# Patient Record
Sex: Male | Born: 1960 | Race: Black or African American | Hispanic: No | Smoking: Current every day smoker
Health system: Southern US, Community
[De-identification: ages and names within clinical notes are randomized; demographics above are authoritative.]

## PROBLEM LIST (undated history)

## (undated) DIAGNOSIS — C801 Malignant (primary) neoplasm, unspecified: Secondary | ICD-10-CM

## (undated) HISTORY — PX: NO PAST SURGERIES: SHX2092

---

## 2003-07-29 ENCOUNTER — Emergency Department (HOSPITAL_COMMUNITY): Admission: EM | Admit: 2003-07-29 | Discharge: 2003-07-29 | Payer: Self-pay | Admitting: Emergency Medicine

## 2005-02-22 ENCOUNTER — Emergency Department (HOSPITAL_COMMUNITY): Admission: EM | Admit: 2005-02-22 | Discharge: 2005-02-22 | Payer: Self-pay | Admitting: Emergency Medicine

## 2006-05-22 ENCOUNTER — Emergency Department (HOSPITAL_COMMUNITY): Admission: EM | Admit: 2006-05-22 | Discharge: 2006-05-22 | Payer: Self-pay | Admitting: Emergency Medicine

## 2008-06-24 ENCOUNTER — Emergency Department (HOSPITAL_COMMUNITY): Admission: EM | Admit: 2008-06-24 | Discharge: 2008-06-24 | Payer: Self-pay | Admitting: Emergency Medicine

## 2009-04-23 ENCOUNTER — Emergency Department (HOSPITAL_COMMUNITY): Admission: EM | Admit: 2009-04-23 | Discharge: 2009-04-23 | Payer: Self-pay | Admitting: Emergency Medicine

## 2010-06-04 LAB — URINALYSIS, ROUTINE W REFLEX MICROSCOPIC
Ketones, ur: NEGATIVE mg/dL
Nitrite: NEGATIVE
Protein, ur: NEGATIVE mg/dL
pH: 6 (ref 5.0–8.0)

## 2010-06-04 LAB — CBC
Platelets: 201 10*3/uL (ref 150–400)
RDW: 13.4 % (ref 11.5–15.5)

## 2010-06-04 LAB — DIFFERENTIAL
Basophils Relative: 0 % (ref 0–1)
Eosinophils Absolute: 0 10*3/uL (ref 0.0–0.7)
Monocytes Absolute: 0.4 10*3/uL (ref 0.1–1.0)
Monocytes Relative: 6 % (ref 3–12)

## 2010-06-04 LAB — PROTIME-INR: INR: 0.89 (ref 0.00–1.49)

## 2010-06-04 LAB — COMPREHENSIVE METABOLIC PANEL
ALT: 19 U/L (ref 0–53)
Albumin: 4 g/dL (ref 3.5–5.2)
Alkaline Phosphatase: 81 U/L (ref 39–117)
Potassium: 4.6 mEq/L (ref 3.5–5.1)
Sodium: 138 mEq/L (ref 135–145)
Total Protein: 7.1 g/dL (ref 6.0–8.3)

## 2010-11-23 ENCOUNTER — Emergency Department (HOSPITAL_COMMUNITY)
Admission: EM | Admit: 2010-11-23 | Discharge: 2010-11-23 | Disposition: A | Discharge status: Home / Self Care | Payer: Self-pay | Attending: Emergency Medicine | Admitting: Emergency Medicine

## 2010-11-23 DIAGNOSIS — IMO0002 Reserved for concepts with insufficient information to code with codable children: Secondary | ICD-10-CM | POA: Insufficient documentation

## 2010-11-23 DIAGNOSIS — H1130 Conjunctival hemorrhage, unspecified eye: Secondary | ICD-10-CM | POA: Insufficient documentation

## 2010-11-23 DIAGNOSIS — S0510XA Contusion of eyeball and orbital tissues, unspecified eye, initial encounter: Secondary | ICD-10-CM | POA: Insufficient documentation

## 2014-06-21 ENCOUNTER — Encounter (HOSPITAL_COMMUNITY): Payer: Self-pay

## 2014-06-21 ENCOUNTER — Emergency Department (HOSPITAL_COMMUNITY)
Admission: EM | Admit: 2014-06-21 | Discharge: 2014-06-21 | Disposition: A | Discharge status: Home / Self Care | Payer: No Typology Code available for payment source | Source: Home / Self Care | Attending: Family Medicine | Admitting: Family Medicine

## 2014-06-21 DIAGNOSIS — J302 Other seasonal allergic rhinitis: Secondary | ICD-10-CM | POA: Diagnosis not present

## 2014-06-21 DIAGNOSIS — J0101 Acute recurrent maxillary sinusitis: Secondary | ICD-10-CM

## 2014-06-21 MED ORDER — METHYLPREDNISOLONE ACETATE 80 MG/ML IJ SUSP
80.0000 mg | Freq: Once | INTRAMUSCULAR | Status: AC
  Administered 2014-06-21: 80 mg via INTRAMUSCULAR

## 2014-06-21 MED ORDER — FLUTICASONE PROPIONATE 50 MCG/ACT NA SUSP: 1.0000 | Freq: Two times a day (BID) | NASAL | Status: DC

## 2014-06-21 MED ORDER — METHYLPREDNISOLONE ACETATE 80 MG/ML IJ SUSP
INTRAMUSCULAR | Status: AC
  Filled 2014-06-21: qty 1

## 2014-06-21 MED ORDER — DOXYCYCLINE HYCLATE 100 MG PO CAPS: 100.0000 mg | ORAL_CAPSULE | Freq: Two times a day (BID) | ORAL | Status: DC

## 2014-06-21 NOTE — Discharge Instructions (Signed)
Take all of medicine, drink lots of fluids, no more smoking, see your doctor if further problems  °

## 2014-06-21 NOTE — ED Notes (Signed)
C/o has been congested since mowed the yard 4-2; minimal relief w OTC

## 2014-06-21 NOTE — ED Provider Notes (Signed)
CSN: [id number]     Arrival date & time 06/21/14  1342 History   First MD Initiated Contact with Patient 06/21/14 1432     Chief Complaint  Patient presents with  . Nasal Congestion   (Consider location/radiation/quality/duration/timing/severity/associated sxs/prior Treatment) Patient is a 54 y.o. male presenting with cough. The history is provided by the patient.  Cough Cough characteristics:  Productive Sputum characteristics:  Green Severity:  Moderate Onset quality:  Gradual Duration:  4 days Progression:  Unchanged Chronicity:  New Smoker: yes   Context: exposure to allergens, upper respiratory infection and weather changes   Associated symptoms: rhinorrhea and sinus congestion   Associated symptoms: no fever, no sore throat and no wheezing     History reviewed. No pertinent past medical history. History reviewed. No pertinent past surgical history. History reviewed. No pertinent family history. History  Substance Use Topics  . Smoking status: Never Smoker   . Smokeless tobacco: Not on file  . Alcohol Use: Not on file    Review of Systems  Constitutional: Negative.  Negative for fever.  HENT: Positive for congestion, postnasal drip and rhinorrhea. Negative for sore throat.   Respiratory: Positive for cough. Negative for wheezing.   Cardiovascular: Negative.   Gastrointestinal: Negative.     Allergies  Review of patient's allergies indicates no active allergies.  Home Medications   Prior to Admission medications   Medication Sig Start Date End Date Taking? Authorizing Provider  doxycycline (VIBRAMYCIN) 100 MG capsule Take 1 capsule (100 mg total) by mouth 2 (two) times daily. 06/21/14   Linna HoffJames D Kindl, MD  fluticasone (FLONASE) 50 MCG/ACT nasal spray Place 1 spray into both nostrils 2 (two) times daily. 06/21/14   Linna HoffJames D Kindl, MD   BP 117/80 mmHg  Pulse 114  Temp(Src) 98.8 F (37.1 C) (Oral)  SpO2 95% Physical Exam  Constitutional: He is oriented to person,  place, and time. He appears well-developed and well-nourished. No distress.  HENT:  Right Ear: External ear normal.  Left Ear: External ear normal.  Mouth/Throat: Oropharynx is clear and moist.  Eyes: Conjunctivae are normal. Pupils are equal, round, and reactive to light.  Neck: Normal range of motion. Neck supple.  Cardiovascular: Normal rate and normal heart sounds.   Pulmonary/Chest: He has decreased breath sounds. He has no wheezes. He has rhonchi. He has no rales.  Lymphadenopathy:    He has no cervical adenopathy.  Neurological: He is alert and oriented to person, place, and time.  Skin: Skin is warm and dry.  Nursing note and vitals reviewed.   ED Course  Procedures (including critical care time) Labs Review Labs Reviewed - No data to display  Imaging Review No results found.   MDM   1. Seasonal allergic rhinitis   2. Acute recurrent maxillary sinusitis        Linna HoffJames D Kindl, MD 06/21/14 1501

## 2015-01-23 ENCOUNTER — Encounter (HOSPITAL_COMMUNITY): Payer: Self-pay

## 2015-01-23 ENCOUNTER — Emergency Department (HOSPITAL_COMMUNITY): Payer: No Typology Code available for payment source

## 2015-01-23 ENCOUNTER — Emergency Department (HOSPITAL_COMMUNITY)
Admission: EM | Admit: 2015-01-23 | Discharge: 2015-01-23 | Disposition: A | Discharge status: Home / Self Care | Payer: No Typology Code available for payment source | Attending: Emergency Medicine | Admitting: Emergency Medicine

## 2015-01-23 ENCOUNTER — Other Ambulatory Visit (HOSPITAL_COMMUNITY): Payer: Self-pay | Admitting: Orthopedic Surgery

## 2015-01-23 ENCOUNTER — Encounter (HOSPITAL_COMMUNITY): Payer: Self-pay | Admitting: *Deleted

## 2015-01-23 DIAGNOSIS — W230XXA Caught, crushed, jammed, or pinched between moving objects, initial encounter: Secondary | ICD-10-CM | POA: Diagnosis not present

## 2015-01-23 DIAGNOSIS — Y998 Other external cause status: Secondary | ICD-10-CM | POA: Diagnosis not present

## 2015-01-23 DIAGNOSIS — S99912A Unspecified injury of left ankle, initial encounter: Secondary | ICD-10-CM | POA: Diagnosis present

## 2015-01-23 DIAGNOSIS — Y9289 Other specified places as the place of occurrence of the external cause: Secondary | ICD-10-CM | POA: Diagnosis not present

## 2015-01-23 DIAGNOSIS — Z720 Tobacco use: Secondary | ICD-10-CM | POA: Insufficient documentation

## 2015-01-23 DIAGNOSIS — S82842A Displaced bimalleolar fracture of left lower leg, initial encounter for closed fracture: Secondary | ICD-10-CM | POA: Diagnosis not present

## 2015-01-23 DIAGNOSIS — Y9389 Activity, other specified: Secondary | ICD-10-CM | POA: Diagnosis not present

## 2015-01-23 MED ORDER — OXYCODONE-ACETAMINOPHEN 5-325 MG PO TABS
1.0000 | ORAL_TABLET | Freq: Once | ORAL | Status: AC
  Administered 2015-01-23: 1 via ORAL
  Filled 2015-01-23: qty 1

## 2015-01-23 MED ORDER — OXYCODONE-ACETAMINOPHEN 5-325 MG PO TABS: 1.0000 | ORAL_TABLET | ORAL | Status: DC | PRN
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
## 2015-01-23 NOTE — ED Notes (Signed)
Patient transported to X-ray 

## 2015-01-23 NOTE — ED Notes (Signed)
Ice applied to ankle

## 2015-01-23 NOTE — Discharge Instructions (Signed)
Ankle Fracture °A fracture is a break in a bone. The ankle joint is made up of three bones. These include the lower (distal) sections of your lower leg bones, called the tibia and fibula, along with a bone in your foot, called the talus. Depending on how bad the break is and if more than one ankle joint bone is broken, a cast or splint is used to protect and keep your injured bone from moving while it heals. Sometimes, surgery is required to help the fracture heal properly.  °There are two general types of fractures: °· Stable fracture. This includes a single fracture line through one bone, with no injury to ankle ligaments. A fracture of the talus that does not have any displacement (movement of the bone on either side of the fracture line) is also stable. °· Unstable fracture. This includes more than one fracture line through one or more bones in the ankle joint. It also includes fractures that have displacement of the bone on either side of the fracture line. °CAUSES °· A direct blow to the ankle.   °· Quickly and severely twisting your ankle. °· Trauma, such as a car accident or falling from a significant height. °RISK FACTORS °You may be at a higher risk of ankle fracture if: °· You have certain medical conditions. °· You are involved in high-impact sports. °· You are involved in a high-impact car accident. °SIGNS AND SYMPTOMS  °· Tender and swollen ankle. °· Bruising around the injured ankle. °· Pain on movement of the ankle. °· Difficulty walking or putting weight on the ankle. °· A cold foot below the site of the ankle injury. This can occur if the blood vessels passing through your injured ankle were also damaged. °· Numbness in the foot below the site of the ankle injury. °DIAGNOSIS  °An ankle fracture is usually diagnosed with a physical exam and X-rays. A CT scan may also be required for complex fractures. °TREATMENT  °Stable fractures are treated with a cast or splint and using crutches to avoid putting  weight on your injured ankle. This is followed by an ankle strengthening program. Some patients require a special type of cast, depending on other medical problems they may have. Unstable fractures require surgery to ensure the bones heal properly. Your health care provider will tell you what type of fracture you have and the best treatment for your condition. °HOME CARE INSTRUCTIONS  °· Review correct crutch use with your health care provider and use your crutches as directed. Safe use of crutches is extremely important. Misuse of crutches can cause you to fall or cause injury to nerves in your hands or armpits. °· Do not put weight or pressure on the injured ankle until directed by your health care provider. °· To lessen the swelling, keep the injured leg elevated while sitting or lying down. °· Apply ice to the injured area: °¨ Put ice in a plastic bag. °¨ Place a towel between your cast and the bag. °¨ Leave the ice on for 20 minutes, 2-3 times a day. °· If you have a plaster or fiberglass cast: °¨ Do not try to scratch the skin under the cast with any objects. This can increase your risk of skin infection. °¨ Check the skin around the cast every day. You may put lotion on any red or sore areas. °¨ Keep your cast dry and clean. °· If you have a plaster splint: °¨ Wear the splint as directed. °¨ You may loosen the elastic   around the splint if your toes become numb, tingle, or turn cold or blue.  Do not put pressure on any part of your cast or splint; it may break. Rest your cast only on a pillow the first 24 hours until it is fully hardened.  Your cast or splint can be protected during bathing with a plastic bag sealed to your skin with medical tape. Do not lower the cast or splint into water.  Take medicines as directed by your health care provider. Only take over-the-counter or prescription medicines for pain, discomfort, or fever as directed by your health care provider.  Do not drive a vehicle until  your health care provider specifically tells you it is safe to do so.  If your health care provider has given you a follow-up appointment, it is very important to keep that appointment. Not keeping the appointment could result in a chronic or permanent injury, pain, and disability. If you have any problem keeping the appointment, call the facility for assistance. SEEK MEDICAL CARE IF: You develop increased swelling or discomfort. SEEK IMMEDIATE MEDICAL CARE IF:   Your cast gets damaged or breaks.  You have continued severe pain.  You develop new pain or swelling after the cast was put on.  Your skin or toenails below the injury turn blue or gray.  Your skin or toenails below the injury feel cold, numb, or have loss of sensitivity to touch.  There is a bad smell or pus draining from under the cast. MAKE SURE YOU:   Understand these instructions.  Will watch your condition.  Will get help right away if you are not doing well or get worse.   This information is not intended to replace advice given to you by your health care provider. Make sure you discuss any questions you have with your health care provider.   Document Released: 02/27/2000 Document Revised: 03/06/2013 Document Reviewed: 09/28/2012 Elsevier Interactive Patient Education 2016 [employer]   Please follow-up immediately with Dr. Lajoyce Cornersuda for further evaluation and management
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
## 2015-01-24 ENCOUNTER — Encounter (HOSPITAL_COMMUNITY)
Admission: RE | Disposition: A | Discharge status: Home / Self Care | Payer: Self-pay | Source: Ambulatory Visit | Attending: Orthopedic Surgery

## 2015-01-24 ENCOUNTER — Ambulatory Visit (HOSPITAL_COMMUNITY): Payer: No Typology Code available for payment source

## 2015-01-24 ENCOUNTER — Ambulatory Visit (HOSPITAL_COMMUNITY): Payer: No Typology Code available for payment source | Admitting: Anesthesiology

## 2015-01-24 ENCOUNTER — Ambulatory Visit (HOSPITAL_COMMUNITY)
Admission: RE | Admit: 2015-01-24 | Discharge: 2015-01-24 | Disposition: A | Discharge status: Home / Self Care | Payer: No Typology Code available for payment source | Source: Ambulatory Visit | Attending: Orthopedic Surgery | Admitting: Orthopedic Surgery

## 2015-01-24 ENCOUNTER — Encounter (HOSPITAL_COMMUNITY): Payer: Self-pay

## 2015-01-24 DIAGNOSIS — F17200 Nicotine dependence, unspecified, uncomplicated: Secondary | ICD-10-CM | POA: Diagnosis not present

## 2015-01-24 DIAGNOSIS — S82842A Displaced bimalleolar fracture of left lower leg, initial encounter for closed fracture: Secondary | ICD-10-CM | POA: Diagnosis present

## 2015-01-24 DIAGNOSIS — Z01811 Encounter for preprocedural respiratory examination: Secondary | ICD-10-CM

## 2015-01-24 DIAGNOSIS — S82843A Displaced bimalleolar fracture of unspecified lower leg, initial encounter for closed fracture: Secondary | ICD-10-CM

## 2015-01-24 HISTORY — PX: ORIF ANKLE FRACTURE: SHX5408

## 2015-01-24 LAB — COMPREHENSIVE METABOLIC PANEL
ALK PHOS: 65 U/L (ref 38–126)
ALT: 12 U/L — AB (ref 17–63)
AST: 45 U/L — ABNORMAL HIGH (ref 15–41)
Albumin: 3.7 g/dL (ref 3.5–5.0)
Anion gap: 14 (ref 5–15)
BILIRUBIN TOTAL: 1.4 mg/dL — AB (ref 0.3–1.2)
BUN: 9 mg/dL (ref 6–20)
CALCIUM: 9.1 mg/dL (ref 8.9–10.3)
CO2: 22 mmol/L (ref 22–32)
CREATININE: 0.91 mg/dL (ref 0.61–1.24)
Chloride: 100 mmol/L — ABNORMAL LOW (ref 101–111)
Glucose, Bld: 78 mg/dL (ref 65–99)
Potassium: 4.9 mmol/L (ref 3.5–5.1)
Sodium: 136 mmol/L (ref 135–145)
TOTAL PROTEIN: 6.8 g/dL (ref 6.5–8.1)

## 2015-01-24 LAB — SURGICAL PCR SCREEN
MRSA, PCR: NEGATIVE
Staphylococcus aureus: NEGATIVE

## 2015-01-24 LAB — CBC
HCT: 36.7 % — ABNORMAL LOW (ref 39.0–52.0)
HEMOGLOBIN: 12.7 g/dL — AB (ref 13.0–17.0)
MCH: 33.1 pg (ref 26.0–34.0)
MCHC: 34.6 g/dL (ref 30.0–36.0)
MCV: 95.6 fL (ref 78.0–100.0)
Platelets: 232 10*3/uL (ref 150–400)
RBC: 3.84 MIL/uL — AB (ref 4.22–5.81)
RDW: 14.2 % (ref 11.5–15.5)
WBC: 6.9 10*3/uL (ref 4.0–10.5)

## 2015-01-24 LAB — APTT: aPTT: 31 seconds (ref 24–37)

## 2015-01-24 LAB — PROTIME-INR
INR: 0.9 (ref 0.00–1.49)
PROTHROMBIN TIME: 12.3 s (ref 11.6–15.2)

## 2015-01-24 SURGERY — OPEN REDUCTION INTERNAL FIXATION (ORIF) ANKLE FRACTURE
Anesthesia: General | Site: Ankle | Laterality: Left

## 2015-01-24 MED ORDER — LACTATED RINGERS IV SOLN
INTRAVENOUS | Status: DC
  Administered 2015-01-24: 15:00:00 via INTRAVENOUS

## 2015-01-24 MED ORDER — FENTANYL CITRATE (PF) 250 MCG/5ML IJ SOLN
INTRAMUSCULAR | Status: AC
  Filled 2015-01-24: qty 5

## 2015-01-24 MED ORDER — OXYCODONE HCL 5 MG PO TABS: 5.0000 mg | ORAL_TABLET | Freq: Once | ORAL | Status: DC | PRN

## 2015-01-24 MED ORDER — ONDANSETRON HCL 4 MG/2ML IJ SOLN
INTRAMUSCULAR | Status: AC
  Filled 2015-01-24: qty 2

## 2015-01-24 MED ORDER — CEFAZOLIN SODIUM-DEXTROSE 2-3 GM-% IV SOLR
2.0000 g | INTRAVENOUS | Status: AC
  Administered 2015-01-24: 2 g via INTRAVENOUS
  Filled 2015-01-24: qty 50

## 2015-01-24 MED ORDER — ONDANSETRON HCL 4 MG/2ML IJ SOLN: 4.0000 mg | Freq: Once | INTRAMUSCULAR | Status: DC | PRN

## 2015-01-24 MED ORDER — MUPIROCIN 2 % EX OINT
1.0000 "application " | TOPICAL_OINTMENT | Freq: Once | CUTANEOUS | Status: AC
  Administered 2015-01-24: 1 via TOPICAL
  Filled 2015-01-24: qty 22

## 2015-01-24 MED ORDER — OXYCODONE HCL 5 MG/5ML PO SOLN: 5.0000 mg | Freq: Once | ORAL | Status: DC | PRN

## 2015-01-24 MED ORDER — PROPOFOL 10 MG/ML IV BOLUS
INTRAVENOUS | Status: DC | PRN
  Administered 2015-01-24: 180 mg via INTRAVENOUS

## 2015-01-24 MED ORDER — 0.9 % SODIUM CHLORIDE (POUR BTL) OPTIME
TOPICAL | Status: DC | PRN
  Administered 2015-01-24: 1000 mL

## 2015-01-24 MED ORDER — ONDANSETRON HCL 4 MG/2ML IJ SOLN
INTRAMUSCULAR | Status: DC | PRN
  Administered 2015-01-24: 4 mg via INTRAVENOUS

## 2015-01-24 MED ORDER — MIDAZOLAM HCL 5 MG/5ML IJ SOLN
INTRAMUSCULAR | Status: DC | PRN
  Administered 2015-01-24 (×2): 1 mg via INTRAVENOUS

## 2015-01-24 MED ORDER — FENTANYL CITRATE (PF) 100 MCG/2ML IJ SOLN
INTRAMUSCULAR | Status: DC | PRN
Start: 2015-01-24 — End: 2015-01-24
  Administered 2015-01-24: 100 ug via INTRAVENOUS
  Administered 2015-01-24: 50 ug via INTRAVENOUS

## 2015-01-24 MED ORDER — LIDOCAINE HCL (CARDIAC) 20 MG/ML IV SOLN
INTRAVENOUS | Status: AC
  Filled 2015-01-24: qty 5

## 2015-01-24 MED ORDER — MIDAZOLAM HCL 2 MG/2ML IJ SOLN
INTRAMUSCULAR | Status: AC
  Filled 2015-01-24: qty 2

## 2015-01-24 MED ORDER — CHLORHEXIDINE GLUCONATE 4 % EX LIQD: 60.0000 mL | Freq: Once | CUTANEOUS | Status: DC

## 2015-01-24 MED ORDER — MUPIROCIN 2 % EX OINT
TOPICAL_OINTMENT | CUTANEOUS | Status: AC
  Filled 2015-01-24: qty 22

## 2015-01-24 MED ORDER — MUPIROCIN 2 % EX OINT
TOPICAL_OINTMENT | CUTANEOUS | Status: DC | PRN
  Administered 2015-01-24: 1 via TOPICAL

## 2015-01-24 MED ORDER — FENTANYL CITRATE (PF) 100 MCG/2ML IJ SOLN: 25.0000 ug | INTRAMUSCULAR | Status: DC | PRN

## 2015-01-24 MED ORDER — PROPOFOL 10 MG/ML IV BOLUS
INTRAVENOUS | Status: AC
  Filled 2015-01-24: qty 20

## 2015-01-24 MED ORDER — DEXAMETHASONE SODIUM PHOSPHATE 4 MG/ML IJ SOLN
INTRAMUSCULAR | Status: AC
  Filled 2015-01-24: qty 2

## 2015-01-24 MED ORDER — ARTIFICIAL TEARS OP OINT
TOPICAL_OINTMENT | OPHTHALMIC | Status: DC | PRN
  Administered 2015-01-24: 1 via OPHTHALMIC

## 2015-01-24 SURGICAL SUPPLY — 42 items
BANDAGE ESMARK 6X9 LF (GAUZE/BANDAGES/DRESSINGS) IMPLANT
BNDG CMPR 9X6 STRL LF SNTH (GAUZE/BANDAGES/DRESSINGS)
BNDG COHESIVE 4X5 TAN STRL (GAUZE/BANDAGES/DRESSINGS) ×3 IMPLANT
BNDG ESMARK 6X9 LF (GAUZE/BANDAGES/DRESSINGS)
BNDG GAUZE ELAST 4 BULKY (GAUZE/BANDAGES/DRESSINGS) ×3 IMPLANT
COVER SURGICAL LIGHT HANDLE (MISCELLANEOUS) ×6 IMPLANT
CUFF TOURNIQUET SINGLE 34IN LL (TOURNIQUET CUFF) IMPLANT
CUFF TOURNIQUET SINGLE 44IN (TOURNIQUET CUFF) IMPLANT
DRAPE INCISE IOBAN 66X45 STRL (DRAPES) ×3 IMPLANT
DRAPE OEC MINIVIEW 54X84 (DRAPES) ×2 IMPLANT
DRAPE PROXIMA HALF (DRAPES) ×3 IMPLANT
DRAPE U-SHAPE 47X51 STRL (DRAPES) ×3 IMPLANT
DRSG ADAPTIC 3X8 NADH LF (GAUZE/BANDAGES/DRESSINGS) ×3 IMPLANT
DRSG PAD ABDOMINAL 8X10 ST (GAUZE/BANDAGES/DRESSINGS) ×3 IMPLANT
DURAPREP 26ML APPLICATOR (WOUND CARE) ×3 IMPLANT
ELECT REM PT RETURN 9FT ADLT (ELECTROSURGICAL) ×3
ELECTRODE REM PT RTRN 9FT ADLT (ELECTROSURGICAL) ×1 IMPLANT
GAUZE SPONGE 4X4 12PLY STRL (GAUZE/BANDAGES/DRESSINGS) ×3 IMPLANT
GLOVE BIOGEL PI IND STRL 9 (GLOVE) ×1 IMPLANT
GLOVE BIOGEL PI INDICATOR 9 (GLOVE) ×2
GLOVE SURG ORTHO 9.0 STRL STRW (GLOVE) ×3 IMPLANT
GOWN STRL REUS W/ TWL XL LVL3 (GOWN DISPOSABLE) ×3 IMPLANT
GOWN STRL REUS W/TWL XL LVL3 (GOWN DISPOSABLE) ×6
GUIDEWIRE THREADED 150MM (WIRE) ×4 IMPLANT
KIT BASIN OR (CUSTOM PROCEDURE TRAY) ×3 IMPLANT
KIT ROOM TURNOVER OR (KITS) ×3 IMPLANT
MANIFOLD NEPTUNE II (INSTRUMENTS) ×1 IMPLANT
NS IRRIG 1000ML POUR BTL (IV SOLUTION) ×3 IMPLANT
PACK ORTHO EXTREMITY (CUSTOM PROCEDURE TRAY) ×3 IMPLANT
PAD ARMBOARD 7.5X6 YLW CONV (MISCELLANEOUS) ×6 IMPLANT
SCREW CANN S THRD/44 4.0 (Screw) ×4 IMPLANT
SPONGE GAUZE 4X4 12PLY STER LF (GAUZE/BANDAGES/DRESSINGS) ×2 IMPLANT
SPONGE LAP 18X18 X RAY DECT (DISPOSABLE) ×1 IMPLANT
STAPLER VISISTAT 35W (STAPLE) IMPLANT
SUCTION FRAZIER TIP 10 FR DISP (SUCTIONS) ×3 IMPLANT
SUT ETHILON 2 0 FS 18 (SUTURE) ×4 IMPLANT
SUT ETHILON 2 0 PSLX (SUTURE) IMPLANT
SUT VIC AB 2-0 CTB1 (SUTURE) ×2 IMPLANT
TOWEL OR 17X24 6PK STRL BLUE (TOWEL DISPOSABLE) ×3 IMPLANT
TOWEL OR 17X26 10 PK STRL BLUE (TOWEL DISPOSABLE) ×3 IMPLANT
TUBE CONNECTING 12'X1/4 (SUCTIONS) ×1
TUBE CONNECTING 12X1/4 (SUCTIONS) ×2 IMPLANT

## 2015-01-24 NOTE — Transfer of Care (Signed)
Immediate Anesthesia Transfer of Care Note  Patient: Jeff [name]  Procedure(s) Performed: Procedure(s): OPEN REDUCTION INTERNAL FIXATION (ORIF) LEFT ANKLE FRACTURE (Left)  Patient Location: PACU  Anesthesia Type:General and Regional  Level of Consciousness: awake, alert  and oriented  Airway & Oxygen Therapy: Patient Spontanous Breathing and Patient connected to nasal cannula oxygen  Post-op Assessment: Report given to RN  Post vital signs: Reviewed and stable  Last Vitals:  Filed Vitals:   01/24/15 1801  BP:   Pulse:   Temp: 37.1 C  Resp:     Complications: No apparent anesthesia complications

## 2015-01-24 NOTE — Anesthesia Postprocedure Evaluation (Signed)
  Anesthesia Post-op Note  Patient: Jeff [name]  Procedure(s) Performed: Procedure(s): OPEN REDUCTION INTERNAL FIXATION (ORIF) LEFT ANKLE FRACTURE (Left)  Patient Location: PACU  Anesthesia Type:General and GA combined with regional for post-op pain  Level of Consciousness: awake, alert  and oriented  Airway and Oxygen Therapy: Patient Spontanous Breathing and Patient connected to nasal cannula oxygen  Post-op Pain: none  Post-op Assessment: Post-op Vital signs reviewed, Patient's Cardiovascular Status Stable, Respiratory Function Stable, Patent Airway and Pain level controlled LLE Motor Response: Purposeful movement, Responds to commands LLE Sensation: Full sensation RLE Motor Response: Purposeful movement, Responds to commands RLE Sensation: Full sensation      Post-op Vital Signs: stable  Last Vitals:  Filed Vitals:   01/24/15 1853  BP:   Pulse: 91  Temp: 36.7 C  Resp: 18    Complications: No apparent anesthesia complications

## 2015-01-24 NOTE — Op Note (Signed)
01/24/2015  5:47 PM  PATIENT:  Jeff [name]    PRE-OPERATIVE DIAGNOSIS:  Left Ankle Bimalleolar Fracture  POST-OPERATIVE DIAGNOSIS:  Same  PROCEDURE:  OPEN REDUCTION INTERNAL FIXATION (ORIF) LEFT ANKLE FRACTURE  SURGEON:  Nadara MustardUDA,Tadashi Burkel V, MD  PHYSICIAN ASSISTANT:None ANESTHESIA:   General  PREOPERATIVE INDICATIONS:  Jeff [name] is a  54 y.o. male with a diagnosis of Left Ankle Bimalleolar Fracture who failed conservative measures and elected for surgical management.    The risks benefits and alternatives were discussed with the patient preoperatively including but not limited to the risks of infection, bleeding, nerve injury, cardiopulmonary complications, the need for revision surgery, among others, and the patient was willing to proceed.  OPERATIVE IMPLANTS: 4.0 cannulated screws 2  OPERATIVE FINDINGS: Nondisplaced fibular fracture with displaced medial malleolar fracture  OPERATIVE PROCEDURE: Patient was brought to the operating room and underwent a general anesthetic. After adequate levels anesthesia were obtained patient's left lower extremity was prepped using DuraPrep draped into a sterile field. A timeout was called. Jeff Flowersoban was used to cover all exposed skin. Patient had some abrasions very close to the surgical incision but these were not directly involved with the surgical field these were draped out with the Ioban. Incision was made over the medial malleolus this was carried down to the fracture site. The fracture edges were freshened. K wires were inserted 2 in the distal fragment the fragment was reduced and the K wires were inserted across the joint. 4.0 cannulated screws were used to stabilize the fracture. C-arm floss be verified a congruent mortise. Multiple views showed no displacement of the fibular fracture either and lateral oblique or AP planes. The wound was irrigated with normal saline. A mupirocin ointment dressing was applied with compression wrap. Patient was  extubated taken to the PACU in stable condition plan for discharge to home.

## 2015-01-24 NOTE — Anesthesia Procedure Notes (Addendum)
Procedure Name: LMA Insertion Date/Time: 01/24/2015 5:22 PM Performed by: Jefm MilesENNIE, JULIE E Pre-anesthesia Checklist: Patient identified, Emergency Drugs available, Suction available, Patient being monitored and Timeout performed Patient Re-evaluated:Patient Re-evaluated prior to inductionOxygen Delivery Method: Circle system utilized Preoxygenation: Pre-oxygenation with 100% oxygen Intubation Type: IV induction Ventilation: Mask ventilation without difficulty LMA: LMA inserted LMA Size: 4.0 Number of attempts: 1 Placement Confirmation: positive ETCO2 and breath sounds checked- equal and bilateral Tube secured with: Tape Dental Injury: Teeth and Oropharynx as per pre-operative assessment     Anesthesia Regional Block:  Popliteal block  Pre-Anesthetic Checklist: ,, timeout performed, Correct Patient, Correct Site, Correct Laterality, Correct Procedure, Correct Position, site marked, Risks and benefits discussed,  Surgical consent,  Pre-op evaluation,  At surgeon's request and post-op pain management  Laterality: Left  Prep: chloraprep       Needles:  Injection technique: Single-shot  Needle Type: Echogenic Stimulator Needle     Needle Length: 9cm 9 cm Needle Gauge: 21 and 21 G    Additional Needles:  Procedures: ultrasound guided (picture in chart) Popliteal block Narrative:  Start time: 01/24/2015 4:40 PM End time: 01/24/2015 4:45 PM Injection made incrementally with aspirations every 5 mL.  Performed by: Personally   Additional Notes: 20 cc 0.5% bupivacaine with 1:200 Epi injected easily   Anesthesia Regional Block:  Adductor canal block  Pre-Anesthetic Checklist: ,, timeout performed, Correct Patient, Correct Site, Correct Laterality, Correct Procedure, Correct Position, site marked, Risks and benefits discussed,  Surgical consent,  Pre-op evaluation,  At surgeon's request and post-op pain management  Laterality: Left  Prep: chloraprep       Needles:   Injection technique: Single-shot  Needle Type: Echogenic Stimulator Needle     Needle Length: 9cm 9 cm Needle Gauge: 21 and 21 G    Additional Needles:  Procedures: ultrasound guided (picture in chart) Adductor canal block Narrative:  Start time: 01/24/2015 4:45 PM End time: 01/24/2015 4:50 PM Injection made incrementally with aspirations every 5 mL.  Performed by: Personally   Additional Notes: 10 cc 0.5% Bupivacaine with 1:200 Epi injected easily
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
## 2015-01-24 NOTE — Anesthesia Preprocedure Evaluation (Addendum)
Anesthesia Evaluation  Patient identified by MRN, date of birth, ID band Patient awake    Reviewed: Allergy & Precautions, NPO status , Patient's Chart, lab work & pertinent test results  Airway Mallampati: II  TM Distance: >3 FB Neck ROM: Full    Dental  (+) Teeth Intact, Poor Dentition, Loose,    Pulmonary Current Smoker,    breath sounds clear to auscultation       Cardiovascular  Rhythm:Regular Rate:Normal     Neuro/Psych    GI/Hepatic   Endo/Other    Renal/GU      Musculoskeletal   Abdominal   Peds  Hematology   Anesthesia Other Findings   Reproductive/Obstetrics                            Anesthesia Physical Anesthesia Plan  ASA: II  Anesthesia Plan: General   Post-op Pain Management: GA combined w/ Regional for post-op pain   Induction: Intravenous  Airway Management Planned: LMA  Additional Equipment:   Intra-op Plan:   Post-operative Plan:   Informed Consent: I have reviewed the patients History and Physical, chart, labs and discussed the procedure including the risks, benefits and alternatives for the proposed anesthesia with the patient or authorized representative who has indicated his/her understanding and acceptance.   Dental advisory given  Plan Discussed with: CRNA and Anesthesiologist  Anesthesia Plan Comments:         Anesthesia Quick Evaluation

## 2015-01-27 ENCOUNTER — Emergency Department (EMERGENCY_DEPARTMENT_HOSPITAL)
Admit: 2015-01-27 | Discharge: 2015-01-27 | Disposition: A | Discharge status: Home / Self Care | Payer: No Typology Code available for payment source | Attending: Emergency Medicine | Admitting: Emergency Medicine

## 2015-01-27 ENCOUNTER — Emergency Department (HOSPITAL_COMMUNITY)
Admission: EM | Admit: 2015-01-27 | Discharge: 2015-01-27 | Disposition: A | Discharge status: Home / Self Care | Payer: No Typology Code available for payment source | Attending: Emergency Medicine | Admitting: Emergency Medicine

## 2015-01-27 ENCOUNTER — Encounter (HOSPITAL_COMMUNITY): Payer: Self-pay | Admitting: Orthopedic Surgery

## 2015-01-27 DIAGNOSIS — R2242 Localized swelling, mass and lump, left lower limb: Secondary | ICD-10-CM | POA: Insufficient documentation

## 2015-01-27 DIAGNOSIS — Z720 Tobacco use: Secondary | ICD-10-CM | POA: Insufficient documentation

## 2015-01-27 DIAGNOSIS — Z4801 Encounter for change or removal of surgical wound dressing: Secondary | ICD-10-CM | POA: Diagnosis not present

## 2015-01-27 DIAGNOSIS — Z5189 Encounter for other specified aftercare: Secondary | ICD-10-CM

## 2015-01-27 DIAGNOSIS — R630 Anorexia: Secondary | ICD-10-CM | POA: Diagnosis not present

## 2015-01-27 DIAGNOSIS — R52 Pain, unspecified: Secondary | ICD-10-CM

## 2015-01-27 DIAGNOSIS — M79662 Pain in left lower leg: Secondary | ICD-10-CM | POA: Insufficient documentation

## 2015-01-27 MED ORDER — MUPIROCIN CALCIUM 2 % EX CREA
TOPICAL_CREAM | Freq: Once | CUTANEOUS | Status: AC
  Administered 2015-01-27: 19:00:00 via TOPICAL
  Filled 2015-01-27: qty 15

## 2015-01-27 NOTE — ED Notes (Signed)
Removed coflex from dressing. Loose wrap to wound

## 2015-01-27 NOTE — Discharge Instructions (Signed)
Wound Check  If you have a wound, it may take some time to heal. Eventually, a scar will form. The scar will also fade with time. It is important to take care of your wound while it is healing. This helps to protect your wound from infection.   HOW SHOULD I TAKE CARE OF MY WOUND AT HOME?   Some wounds are allowed to close on their own or are repaired at a later date. There are many different ways to close and cover a wound, including stitches (sutures), skin glue, and adhesive strips. Follow your health care provider's instructions about:    Wound care.    Bandage (dressing) changes and removal.    Wound closure removal.   Take medicines only as directed by your health care provider.   Keep all follow-up visits as directed by your health care provider. This is important.   Do not take baths, swim, or use a hot tub until your health care provider approves. You may shower as directed by your health care provider.   Keep your wound clean and dry.  WHAT AFFECTS SCAR FORMATION?  Scars affect each person differently. How your body scars depends on:   The location and size of your wound.   Traits that you inherited from your parents (genetic predisposition).   How you take care of your wound. Irritation and inflammation increase the amount of scar formation.   Sun exposure. This can darken a scar.  WHEN SHOULD I CALL OR SEE MY HEALTH CARE PROVIDER?  Call or see your health care provider if:   You have redness, swelling, or pain at your wound site.   You have fluid, blood, or pus coming from your wound.   You have muscle aches, chills, or a general ill feeling.   You notice a bad smell coming from the wound.   Your wound separates after the sutures, staples, or skin adhesive strips have been removed.   You have persistent nausea or vomiting.   You have a fever.   You are dizzy.  WHEN SHOULD I CALL 911 OR GO TO THE EMERGENCY ROOM?  Call 911 or go to the emergency room if:   You faint.   You have difficulty  breathing.     This information is not intended to replace advice given to you by your health care provider. Make sure you discuss any questions you have with your health care provider.     Document Released: 12/06/2003 Document Revised: 03/22/2014 Document Reviewed: 12/11/2013  Elsevier Interactive Patient Education 2016 [employer]

## 2015-01-27 NOTE — ED Provider Notes (Signed)
CSN: [id number]     Arrival date & time 01/27/15  1545 History  By signing my name below, I, Jeff [name], attest that this documentation has been prepared under the direction and in the presence of Felicie Mornavid Derrica Sieg, NP.  Electronically Signed: [name], ED Scribe. 01/27/2015. 6:05 PM.    Chief Complaint  Patient presents with  . Post-op Problem      The history is provided by the patient. No language interpreter was used.   HPI Comments: Jeff [name] is a 54 y.o. male who presents to the Emergency Department complaining of a post-op problem after receiving surgery on his left ankle three days ago. Pt was seen in ED 11/11 for an injury to his left ankle and was taken to surgery to have his fracture repaired. Pt states that he now has worsening left lower leg pain with associated swelling that has been present for a few days. Pt states he was given a soft wrap and an ace bandage to cover the dressing on his ankle, and reports the ace bandage was "way too tight". Pt states he has also had a decreased appetite recently as he states he has not eaten very much over the past two days.   History reviewed. No pertinent past medical history. Past Surgical History  Procedure Laterality Date  . No past surgeries    . Orif ankle fracture Left 01/24/2015    Procedure: OPEN REDUCTION INTERNAL FIXATION (ORIF) LEFT ANKLE FRACTURE;  Surgeon: Nadara MustardMarcus Duda V, MD;  Location: [facility] OR;  Service: Orthopedics;  Laterality: Left;   History reviewed. No pertinent family history. Social History  Substance Use Topics  . Smoking status: Current Every Day Smoker -- 1.00 packs/day for 30 years  . Smokeless tobacco: Never Used  . Alcohol Use: 1.2 oz/week    2 Cans of beer per week     Comment: weekends    Review of Systems  Constitutional: Positive for appetite change.  Cardiovascular: Positive for leg swelling.  Musculoskeletal: Positive for myalgias and joint swelling.  All other systems reviewed and are  negative.     Allergies  Review of patient's allergies indicates no known allergies.  Home Medications   Prior to Admission medications   Medication Sig Start Date End Date Taking? Authorizing Provider  Aspirin-Salicylamide-Caffeine (BC HEADACHE POWDER PO) Take 1 each by mouth as needed (for headache).    Historical Provider, MD  ibuprofen (ADVIL,MOTRIN) 200 MG tablet Take 400 mg by mouth every 6 (six) hours as needed for moderate pain.     Historical Provider, MD  oxyCODONE-acetaminophen (PERCOCET/ROXICET) 5-325 MG tablet Take 1 tablet by mouth every 4 (four) hours as needed for severe pain. 01/23/15   Jeff Hedges, PA-C   BP 144/97 mmHg  Pulse 101  Temp(Src) 98.5 F (36.9 C) (Oral)  Resp 16  Ht 5\' 9"  (1.753 m)  Wt 125 lb (56.7 kg)  BMI 18.45 kg/m2  SpO2 100% Physical Exam  Constitutional: He is oriented to person, place, and time. He appears well-developed and well-nourished.  HENT:  Head: Normocephalic and atraumatic.  Cardiovascular: Normal rate.   Pulmonary/Chest: Effort normal.  Abdominal: He exhibits no distension.  Musculoskeletal:  No numbness in toes Brisk capillary refill DP pulses intact  Sutured surgical wound without indications of erythema or drainage to his left lateral ankle  Neurological: He is alert and oriented to person, place, and time.  Skin: Skin is warm and dry.  Psychiatric: He has a normal mood and affect.  Nursing note and vitals reviewed.   ED Course  Procedures (including critical care time)  DIAGNOSTIC STUDIES: Oxygen Saturation is 100% on room air, normal by my interpretation.    COORDINATION OF CARE: 5:53 PM Discussed treatment plan with pt at bedside and pt agreed to plan.   Labs Review Labs Reviewed - No data to display  Imaging Review No results found. I have personally reviewed and evaluated these images and lab results as part of my medical decision-making.   EKG Interpretation None     No DVT on LE [place]. Calf  pain resolved after compression dressing removed. Post-surgical bruising to left lower leg. Non-infected appearing sutured wound medial aspect of left foot. Cleaned with mupirocin dressing applied. Compression dressing reapplied by ortho.  MDM   Final diagnoses:  Pain    Left calf pain. Wound check.  I personally performed the services described in this documentation, which was scribed in my presence. The recorded information has been reviewed and is accurate.    Felicie Morn, NP 01/28/15 1478  Mancel Bale, MD 01/30/15 [phone]

## 2015-01-27 NOTE — ED Notes (Signed)
Called ortho tech,

## 2015-01-27 NOTE — ED Notes (Addendum)
Pt was seen here on Friday following foot injury, was taken to surgery for fracture repair. Reports now has swelling and pain to left calf. No acute distress noted at triage. Has splint on pta, able to move digits. Denies any pain to foot or toes, but does have tightness and discoloration to left calf area.

## 2015-01-27 NOTE — Progress Notes (Signed)
Orthopedic Tech Progress Note Patient Details:  Jeff BondDottis L [name] 04/17/1960 [id number]  Ortho Devices Type of Ortho Device: Ace wrap Ortho Device/Splint Interventions: Application   Saul FordyceJennifer C Alvie Fowles 01/27/2015, 7:04 PM

## 2015-01-27 NOTE — ED Notes (Signed)
Ortho tech here to complete the compression dressing.

## 2015-01-27 NOTE — Progress Notes (Signed)
*  Preliminary Results* Left lower extremity venous duplex completed. Left lower extremity is negative for deep vein thrombosis. There is no evidence of left Baker's cyst.  01/27/2015 4:58 PM  Gertie FeyMichelle Mikaiah Stoffer, RVT, RDCS, RDMS

## 2016-03-26 ENCOUNTER — Telehealth (INDEPENDENT_AMBULATORY_CARE_PROVIDER_SITE_OTHER): Payer: Self-pay | Admitting: Orthopedic Surgery

## 2016-03-26 NOTE — Telephone Encounter (Signed)
Pt requesting an ankle brace. He said the kind you just slip on, he wants to wear inside his boots at work.  Pt number [phone]

## 2016-03-29 NOTE — Telephone Encounter (Signed)
I called left voicemail for patient to advise if this is an ASO lace up ankle brace. Advised him I will be back at the office at the normal location.

## 2016-04-05 ENCOUNTER — Other Ambulatory Visit (INDEPENDENT_AMBULATORY_CARE_PROVIDER_SITE_OTHER): Payer: Self-pay | Admitting: Orthopedic Surgery

## 2016-04-09 ENCOUNTER — Ambulatory Visit (INDEPENDENT_AMBULATORY_CARE_PROVIDER_SITE_OTHER): Payer: BLUE CROSS/BLUE SHIELD | Admitting: Family

## 2016-04-09 ENCOUNTER — Ambulatory Visit (INDEPENDENT_AMBULATORY_CARE_PROVIDER_SITE_OTHER): Payer: Self-pay

## 2016-04-09 ENCOUNTER — Encounter (INDEPENDENT_AMBULATORY_CARE_PROVIDER_SITE_OTHER): Payer: Self-pay

## 2016-04-09 ENCOUNTER — Encounter (INDEPENDENT_AMBULATORY_CARE_PROVIDER_SITE_OTHER): Payer: Self-pay | Admitting: Family

## 2016-04-09 VITALS — Ht 69.0 in | Wt 125.0 lb

## 2016-04-09 DIAGNOSIS — L84 Corns and callosities: Secondary | ICD-10-CM | POA: Diagnosis not present

## 2016-04-09 DIAGNOSIS — M79675 Pain in left toe(s): Secondary | ICD-10-CM

## 2016-04-09 NOTE — Progress Notes (Signed)
Office Visit Note   Patient: Jeff [name]           Date of Birth: [birth year]           MRN: [id number] Visit Date: 04/09/2016              Requested by: No referring provider defined for this encounter. PCP: No primary care provider on file.  Chief Complaint  Patient presents with  . Left Great Toe - Pain    HPI: Patient presents with left great toe pain for approximately 2-3 weeks. He denies any known injury. He does not know if this is due to colder weather, he states his shoes are properly fitting. Pain is at the tip of left great toe. He denies swelling. He is walking down a ramp at his home increases his pain. As this pushes his toes into the front of his shoe.  He states his family was diagnosed prediabetic.       Assessment & Plan: Visit Diagnoses:  1. Great toe pain, left     Plan: Will ensure properly fitting shoe wear. Follow up in office as needed.   Follow-Up Instructions: No Follow-up on file.   Ortho Exam Physical Exam  Constitutional: Appears well-developed.  Head: Normocephalic.  Eyes: EOM are normal.  Neck: Normal range of motion.  Cardiovascular: Normal rate.   Pulmonary/Chest: Effort normal.  Neurological: Is alert.  Skin: Skin is warm.  Psychiatric: Has a normal mood and affect. Left Foot: Foot is plantigrade. Does have thickened callus over the distal tip. This is tender. No open areas. No erythema or drainage. Callus was pared revealing underlying corn which was pared. Debrided back to viable tissue.   Imaging: No results found.  Orders:  Orders Placed This Encounter  Procedures  . XR Toe Great Left   No orders of the defined types were placed in this encounter.    Procedures: No procedures performed  Clinical Data: No additional findings.  Subjective: Review of Systems  Constitutional: Negative for chills and fever.  Cardiovascular: Negative for leg swelling.  Musculoskeletal: Positive for myalgias.  Skin: Negative for  wound.    Objective: Vital Signs: Ht 5\' 9"  (1.753 m)   Wt 125 lb (56.7 kg)   BMI 18.46 kg/m   Specialty Comments:  No specialty comments available.  PMFS History: There are no active problems to display for this patient.  History reviewed. No pertinent past medical history.  History reviewed. No pertinent family history.  Past Surgical History:  Procedure Laterality Date  . NO PAST SURGERIES    . ORIF ANKLE FRACTURE Left 01/24/2015   Procedure: OPEN REDUCTION INTERNAL FIXATION (ORIF) LEFT ANKLE FRACTURE;  Surgeon: Nadara Mustard, MD;  Location: [facility] OR;  Service: Orthopedics;  Laterality: Left;   Social History   Occupational History  . Not on file.   Social History Main Topics  . Smoking status: Current Every Day Smoker    Packs/day: 1.00    Years: 30.00  . Smokeless tobacco: Never Used  . Alcohol use 1.2 oz/week    2 Cans of beer per week     Comment: weekends  . Drug use: Yes    Types: Marijuana     Comment: 01/19/2015  . Sexual activity: Not on file

## 2016-05-27 ENCOUNTER — Encounter (INDEPENDENT_AMBULATORY_CARE_PROVIDER_SITE_OTHER): Payer: Self-pay | Admitting: Family

## 2016-05-27 ENCOUNTER — Ambulatory Visit (INDEPENDENT_AMBULATORY_CARE_PROVIDER_SITE_OTHER): Payer: BLUE CROSS/BLUE SHIELD

## 2016-05-27 ENCOUNTER — Ambulatory Visit (INDEPENDENT_AMBULATORY_CARE_PROVIDER_SITE_OTHER): Payer: BLUE CROSS/BLUE SHIELD | Admitting: Family

## 2016-05-27 VITALS — Ht 69.0 in | Wt 125.0 lb

## 2016-05-27 DIAGNOSIS — M1712 Unilateral primary osteoarthritis, left knee: Secondary | ICD-10-CM

## 2016-05-27 DIAGNOSIS — M25562 Pain in left knee: Secondary | ICD-10-CM

## 2016-05-27 DIAGNOSIS — G8929 Other chronic pain: Secondary | ICD-10-CM

## 2016-05-27 MED ORDER — LIDOCAINE HCL 1 % IJ SOLN
5.0000 mL | INTRAMUSCULAR | Status: AC | PRN
  Administered 2016-05-27: 5 mL

## 2016-05-27 MED ORDER — METHYLPREDNISOLONE ACETATE 40 MG/ML IJ SUSP
40.0000 mg | INTRAMUSCULAR | Status: AC | PRN
  Administered 2016-05-27: 40 mg via INTRA_ARTICULAR

## 2016-05-27 NOTE — Progress Notes (Addendum)
Office Visit Note   Patient: Jeff [name]           Date of Birth: 05/27/1960           MRN: [id number] Visit Date: 05/27/2016              Requested by: No referring provider defined for this encounter. PCP: No PCP Per Patient  Chief Complaint  Patient presents with  . Left Knee - Pain    HPI: Patient complains of left knee pain. He states that he does alot of walking at work and that he is on concrete all day and that this has his knee feeling tight and swollen. Decreased rom , painful ambulation and pt is using a cane to maintain balance for distance walking. Rodena Medin, RMA  Patient is a 56 year old gentleman who presents today for evaluation of left knee pain. This is chronic. Has noticed it has gotten a lot worse in the last 4 weeks. Must be on his feet all day for work. Feels this worsens his pain. Has noticed a lot of pain going up and down a ramp at work. The knee is tight feels swollen. There is no appreciable swelling. Has been using a cane to steady himself with ambulation. Feels the knee will give way on him.    Assessment & Plan: Visit Diagnoses:  1. Chronic pain of left knee     Plan: Injection today. He will follow-up in office in 4 weeks. May be to consider advanced imaging to rule out meniscal pathology. Also provided him with a knee brace  Follow-Up Instructions: No Follow-up on file.  Physical Exam  Constitutional: Appears well-developed.  Head: Normocephalic.  Eyes: EOM are normal.  Neck: Normal range of motion.  Cardiovascular: Normal rate.   Pulmonary/Chest: Effort normal.  Neurological: Is alert.  Skin: Skin is warm.  Psychiatric: Has a normal mood and affect.  Left Knee Exam   Tenderness  The patient is experiencing tenderness in the medial joint line and medial retinaculum.  Range of Motion  The patient has normal left knee ROM.  Muscle Strength   The patient has normal left knee strength.  Tests  Varus: negative Valgus:  negative  Other  Erythema: absent Swelling: none       Imaging: No results found.  Labs: No results found for: HGBA1C, ESRSEDRATE, CRP, LABURIC, REPTSTATUS, GRAMSTAIN, CULT, LABORGA  Orders:  Orders Placed This Encounter  Procedures  . XR Knee 1-2 Views Left   No orders of the defined types were placed in this encounter.    Procedures: Large Joint Inj Date/Time: 05/27/2016 3:34 PM Performed by: Adonis Huguenin Authorized by: Barnie Del R   Consent Given by:  Patient Site marked: the procedure site was marked   Timeout: prior to procedure the correct patient, procedure, and site was verified   Indications:  Pain and diagnostic evaluation Location:  Knee Site:  L knee Needle Size:  22 G Needle Length:  1.5 inches Ultrasound Guidance: No   Fluoroscopic Guidance: No   Arthrogram: No   Medications:  5 mL lidocaine 1 %; 40 mg methylPREDNISolone acetate 40 MG/ML Aspiration Attempted: No   Patient tolerance:  Patient tolerated the procedure well with no immediate complications    Clinical Data: No additional findings.  Subjective: Review of Systems  Constitutional: Negative for chills and fever.  Cardiovascular: Negative for leg swelling.  Musculoskeletal: Positive for arthralgias. Negative for joint swelling and myalgias.    Objective:  Vital Signs: Ht 5\' 9"  (1.753 m)   Wt 125 lb (56.7 kg)   BMI 18.46 kg/m   Specialty Comments:  No specialty comments available.  PMFS History: There are no active problems to display for this patient.  No past medical history on file.  No family history on file.  Past Surgical History:  Procedure Laterality Date  . NO PAST SURGERIES    . ORIF ANKLE FRACTURE Left 01/24/2015   Procedure: OPEN REDUCTION INTERNAL FIXATION (ORIF) LEFT ANKLE FRACTURE;  Surgeon: Nadara Mustard, MD;  Location: [facility] OR;  Service: Orthopedics;  Laterality: Left;   Social History   Occupational History  . Not on file.   Social History Main  Topics  . Smoking status: Current Every Day Smoker    Packs/day: 1.00    Years: 30.00  . Smokeless tobacco: Never Used  . Alcohol use 1.2 oz/week    2 Cans of beer per week     Comment: weekends  . Drug use: Yes    Types: Marijuana     Comment: 01/19/2015  . Sexual activity: Not on file

## 2016-06-21 ENCOUNTER — Ambulatory Visit (INDEPENDENT_AMBULATORY_CARE_PROVIDER_SITE_OTHER): Payer: BLUE CROSS/BLUE SHIELD | Admitting: Orthopedic Surgery

## 2016-07-05 ENCOUNTER — Ambulatory Visit (INDEPENDENT_AMBULATORY_CARE_PROVIDER_SITE_OTHER): Payer: BLUE CROSS/BLUE SHIELD | Admitting: Orthopedic Surgery

## 2016-08-16 ENCOUNTER — Telehealth (INDEPENDENT_AMBULATORY_CARE_PROVIDER_SITE_OTHER): Payer: Self-pay | Admitting: Orthopedic Surgery

## 2016-08-16 NOTE — Telephone Encounter (Signed)
Patient called asked if Dr Lajoyce Cornersuda can write a letter for him stating that he was released to work full duty at [employer]. . Patient said he need the letter for his attorney. Patient said he can pick up the letter. The number to contact patient is [phone]

## 2016-08-17 NOTE — Telephone Encounter (Signed)
I called to advise letter at front desk. [facility] to return without restrictions on 07/2015

## 2016-12-21 ENCOUNTER — Telehealth (INDEPENDENT_AMBULATORY_CARE_PROVIDER_SITE_OTHER): Payer: Self-pay | Admitting: Orthopedic Surgery

## 2016-12-21 NOTE — Telephone Encounter (Signed)
Done. Patient aware CD is ready for pickup 

## 2016-12-21 NOTE — Telephone Encounter (Signed)
Patient called asking for a disc with all his xrays/mris. CB # [phone]

## 2017-06-08 ENCOUNTER — Emergency Department (HOSPITAL_COMMUNITY): Payer: BLUE CROSS/BLUE SHIELD

## 2017-06-08 ENCOUNTER — Emergency Department (HOSPITAL_COMMUNITY)
Admission: EM | Admit: 2017-06-08 | Discharge: 2017-06-08 | Disposition: A | Discharge status: Home / Self Care | Payer: BLUE CROSS/BLUE SHIELD | Attending: Emergency Medicine | Admitting: Emergency Medicine

## 2017-06-08 ENCOUNTER — Encounter (HOSPITAL_COMMUNITY): Payer: Self-pay

## 2017-06-08 DIAGNOSIS — F17210 Nicotine dependence, cigarettes, uncomplicated: Secondary | ICD-10-CM | POA: Diagnosis not present

## 2017-06-08 DIAGNOSIS — Y93E1 Activity, personal bathing and showering: Secondary | ICD-10-CM | POA: Insufficient documentation

## 2017-06-08 DIAGNOSIS — Z79899 Other long term (current) drug therapy: Secondary | ICD-10-CM | POA: Diagnosis not present

## 2017-06-08 DIAGNOSIS — Y92002 Bathroom of unspecified non-institutional (private) residence single-family (private) house as the place of occurrence of the external cause: Secondary | ICD-10-CM | POA: Diagnosis not present

## 2017-06-08 DIAGNOSIS — Y999 Unspecified external cause status: Secondary | ICD-10-CM | POA: Diagnosis not present

## 2017-06-08 DIAGNOSIS — W182XXA Fall in (into) shower or empty bathtub, initial encounter: Secondary | ICD-10-CM | POA: Insufficient documentation

## 2017-06-08 DIAGNOSIS — S20212A Contusion of left front wall of thorax, initial encounter: Secondary | ICD-10-CM

## 2017-06-08 DIAGNOSIS — S299XXA Unspecified injury of thorax, initial encounter: Secondary | ICD-10-CM | POA: Diagnosis present

## 2017-06-08 MED ORDER — OXYCODONE-ACETAMINOPHEN 5-325 MG PO TABS: 1.0000 | ORAL_TABLET | ORAL | 0 refills | Status: DC | PRN

## 2017-06-08 MED ORDER — KETOROLAC TROMETHAMINE 60 MG/2ML IM SOLN
30.0000 mg | Freq: Once | INTRAMUSCULAR | Status: AC
  Administered 2017-06-08: 30 mg via INTRAMUSCULAR
  Filled 2017-06-08: qty 2

## 2017-06-08 MED ORDER — CETIRIZINE HCL 10 MG PO TABS: 10.0000 mg | ORAL_TABLET | Freq: Every day | ORAL | 0 refills | Status: DC

## 2017-06-08 MED ORDER — OXYCODONE-ACETAMINOPHEN 5-325 MG PO TABS
2.0000 | ORAL_TABLET | Freq: Once | ORAL | Status: AC
  Administered 2017-06-08: 2 via ORAL
  Filled 2017-06-08: qty 2

## 2017-06-08 MED ORDER — CAMPHOR-MENTHOL-METHYL SAL 1.2-5.7-6.3 % EX PTCH: MEDICATED_PATCH | CUTANEOUS | 0 refills | Status: DC

## 2017-06-08 NOTE — ED Triage Notes (Signed)
Patient fell in bath tub yesterday injuring left ribs, pain with any movement and or inspirtion

## 2017-06-08 NOTE — ED Provider Notes (Signed)
[facility] Provider Note   CSN: [id number] Arrival date & time: 06/08/17  [id number]     History   Chief Complaint CC: left rib pain s/p fall yesterday  HPI   Blood pressure 125/77, pulse 93, temperature 98.6 F (37 C), temperature source Oral, resp. rate 16, height 5\' 7"  (1.702 m), weight 56.7 kg (125 lb), SpO2 96 %.  Jeff [name] is a 57 y.o. male complaining of pain to left lateral and posterior ribs alcohol while getting out of the shower yesterday.  There was no head trauma, LOC, change in vision, nausea or vomiting, cervicalgia, abdominal pain difficulty moving major joints.  He states that the pain in the ribs is severe exacerbated by movement, palpation, cough and deep breathing.  He has been applying a hand cream to the area with little relief.  He has been coughing significantly over the course of the last several weeks because he believes that he has seasonal allergy.  He does not take any allergy medications.  He does not have primary care, no fever, chills, rhinorrhea or sick contacts.  History reviewed. No pertinent past medical history.  There are no active problems to display for this patient.   Past Surgical History:  Procedure Laterality Date  . NO PAST SURGERIES    . ORIF ANKLE FRACTURE Left 01/24/2015   Procedure: OPEN REDUCTION INTERNAL FIXATION (ORIF) LEFT ANKLE FRACTURE;  Surgeon: Nadara MustardMarcus Duda V, MD;  Location: [facility] OR;  Service: Orthopedics;  Laterality: Left;        Home Medications    Prior to Admission medications   Medication Sig Start Date End Date Taking? Authorizing Provider  Aspirin-Salicylamide-Caffeine (BC HEADACHE POWDER PO) Take 1 each by mouth as needed (for headache).    [provider]  Camphor-Menthol-Methyl Sal (HM SALONPAS PAIN RELIEF) 1.2-5.7-6.3 % PTCH Clean and dry affected area, remove patch from backing film and apply to skin. Apply one patch to the affected area and leave in place for up  to 8 to 12 hours. If pain lasts after using the first patch, a second patch may be applied for up to another 8 to 12 hours. 06/08/17   Zarin Hagmann, Joni ReiningNicole, PA-C  cetirizine (ZYRTEC) 10 MG tablet Take 1 tablet (10 mg total) by mouth daily. 06/08/17   Keyshon Stein, Joni ReiningNicole, PA-C  ibuprofen (ADVIL,MOTRIN) 200 MG tablet Take 400 mg by mouth every 6 (six) hours as needed for moderate pain.     [provider]  oxyCODONE-acetaminophen (PERCOCET) 5-325 MG tablet Take 1 tablet by mouth every 4 (four) hours as needed. 06/08/17   Shahzaib Azevedo, Mardella LaymanNicole, PA-C    Family History No family history on file.  Social History Social History   Tobacco Use  . Smoking status: Current Every Day Smoker    Packs/day: 1.00    Years: 30.00    Pack years: 30.00  . Smokeless tobacco: Never Used  Substance Use Topics  . Alcohol use: Yes    Alcohol/week: 1.2 oz    Types: 2 Cans of beer per week    Comment: weekends  . Drug use: Yes    Types: Marijuana    Comment: 01/19/2015     Allergies   Patient has no known allergies.   Review of Systems Review of Systems  A complete review of systems was obtained and all systems are negative except as noted in the HPI and PMH.   Physical Exam Updated Vital Signs BP 125/77 (BP Location: Right Arm)  Pulse 93   Temp 98.6 F (37 C) (Oral)   Resp 16   Ht 5\' 7"  (1.702 m)   Wt 56.7 kg (125 lb)   SpO2 96%   BMI 19.58 kg/m   Physical Exam  Constitutional: He is oriented to person, place, and time. He appears well-developed and well-nourished. No distress.  HENT:  Head: Normocephalic and atraumatic.  Mouth/Throat: Oropharynx is clear and moist.  Eyes: Pupils are equal, round, and reactive to light. Conjunctivae and EOM are normal.  Neck: Normal range of motion.  Cardiovascular: Normal rate, regular rhythm and intact distal pulses.  Pulmonary/Chest: Effort normal and breath sounds normal. No stridor. No respiratory distress. He has no wheezes. He has no rales.      He exhibits tenderness.  Tender to palpation as diagrammed with no ecchymoses, crepitance, lung sounds clear bilaterally    Abdominal: Soft. There is no tenderness.  Musculoskeletal: Normal range of motion.  Neurological: He is alert and oriented to person, place, and time.  Skin: He is not diaphoretic.  Psychiatric: He has a normal mood and affect.  Nursing note and vitals reviewed.    ED Treatments / Results  Labs (all labs ordered are listed, but only abnormal results are displayed) Labs Reviewed - No data to display  EKG None  Radiology Dg Ribs Unilateral W/chest Left  Result Date: 06/08/2017 CLINICAL DATA:  Pain following fall EXAM: LEFT RIBS AND CHEST - 3+ VIEW COMPARISON:  Chest radiograph January 24, 2015 FINDINGS: Frontal chest as well as oblique and cone-down rib images obtained. The lungs are clear. The heart size and pulmonary vascularity are normal. No adenopathy. No pneumothorax or pleural effusion. There is no evident rib fracture. IMPRESSION: No appreciable rib fracture. No edema or consolidation. No pneumothorax. Electronically Signed   By: Bretta Bang III M.D.   On: 06/08/2017 08:10    Procedures Procedures (including critical care time)  Medications Ordered in ED Medications  ketorolac (TORADOL) injection 30 mg (has no administration in time range)  oxyCODONE-acetaminophen (PERCOCET/ROXICET) 5-325 MG per tablet 2 tablet (has no administration in time range)     Initial Impression / Assessment and Plan / ED Course  I have reviewed the triage vital signs and the nursing notes.  Pertinent labs & imaging results that were available during my care of the patient were reviewed by me and considered in my medical decision making (see chart for details).     Vitals:   06/08/17 0735 06/08/17 0737  BP: 125/77   Pulse: 93   Resp: 16   Temp: 98.6 F (37 C)   TempSrc: Oral   SpO2: 96%   Weight:  56.7 kg (125 lb)  Height:  5\' 7"  (1.702 m)     Medications  ketorolac (TORADOL) injection 30 mg (has no administration in time range)  oxyCODONE-acetaminophen (PERCOCET/ROXICET) 5-325 MG per tablet 2 tablet (has no administration in time range)    Akashdeep L Barish is 57 y.o. male presenting with left rib pain status post fall while getting out of the shower yesterday, lung sounds clear, patient is saturating well on room air.  Rib series with no fracture.  Likely rib contusion, he has been having cough which he attributes to seasonal allergy.  Will write allergy medications in addition at patient request.  Advised patient it is important to breathe deeply to prevent collapse and pneumonia.  Work note provided.  Evaluation does not show pathology that would require ongoing emergent intervention or inpatient treatment. Pt  is hemodynamically stable and mentating appropriately. Discussed findings and plan with patient/guardian, who agrees with care plan. All questions answered. Return precautions discussed and outpatient follow up given.      Final Clinical Impressions(s) / ED Diagnoses   Final diagnoses:  Rib contusion, left, initial encounter    ED Discharge Orders        Ordered    Camphor-Menthol-Methyl Sal (HM SALONPAS PAIN RELIEF) 1.2-5.7-6.3 % PTCH     06/08/17 1002    oxyCODONE-acetaminophen (PERCOCET) 5-325 MG tablet  Every 4 hours PRN     06/08/17 1002    cetirizine (ZYRTEC) 10 MG tablet  Daily     06/08/17 1002       Arsenio Schnorr, [place], PA-C 06/08/17 1009    Melene Plan, DO 06/08/17 1012

## 2017-06-08 NOTE — Discharge Instructions (Addendum)
Take percocet for breakthrough pain, do not drink alcohol, drive, care for children or do other critical tasks while taking percocet.   It is very important that you take deep breaths to prevent lung collapse and infection.  Either use your incentive spirometer or take 10 deep breaths every hour to prevent lung collapse.  If you develop cough, fever or shortness of breath return immediately to the emergency room.   Please obtain primary care using resource guide below. Let them know that you were seen in the emergency room and that they will need to obtain records for further outpatient management.

## 2017-06-08 NOTE — ED Notes (Signed)
Patient transported to X-ray 

## 2017-06-08 NOTE — ED Notes (Signed)
Pt stated that he fell out of the bathtub and hit his L ribcage on the tub. Pain is now moving to left back. No bruising or swelling of the area.

## 2018-12-27 IMAGING — DX DG RIBS W/ CHEST 3+V*L*
3 series · 3 of 3 positions shown · non-contrast
Comparison: Chest radiograph January 24, 2015

CLINICAL DATA: Pain following fall

EXAM:
LEFT RIBS AND CHEST - 3+ VIEW

[chest pa]
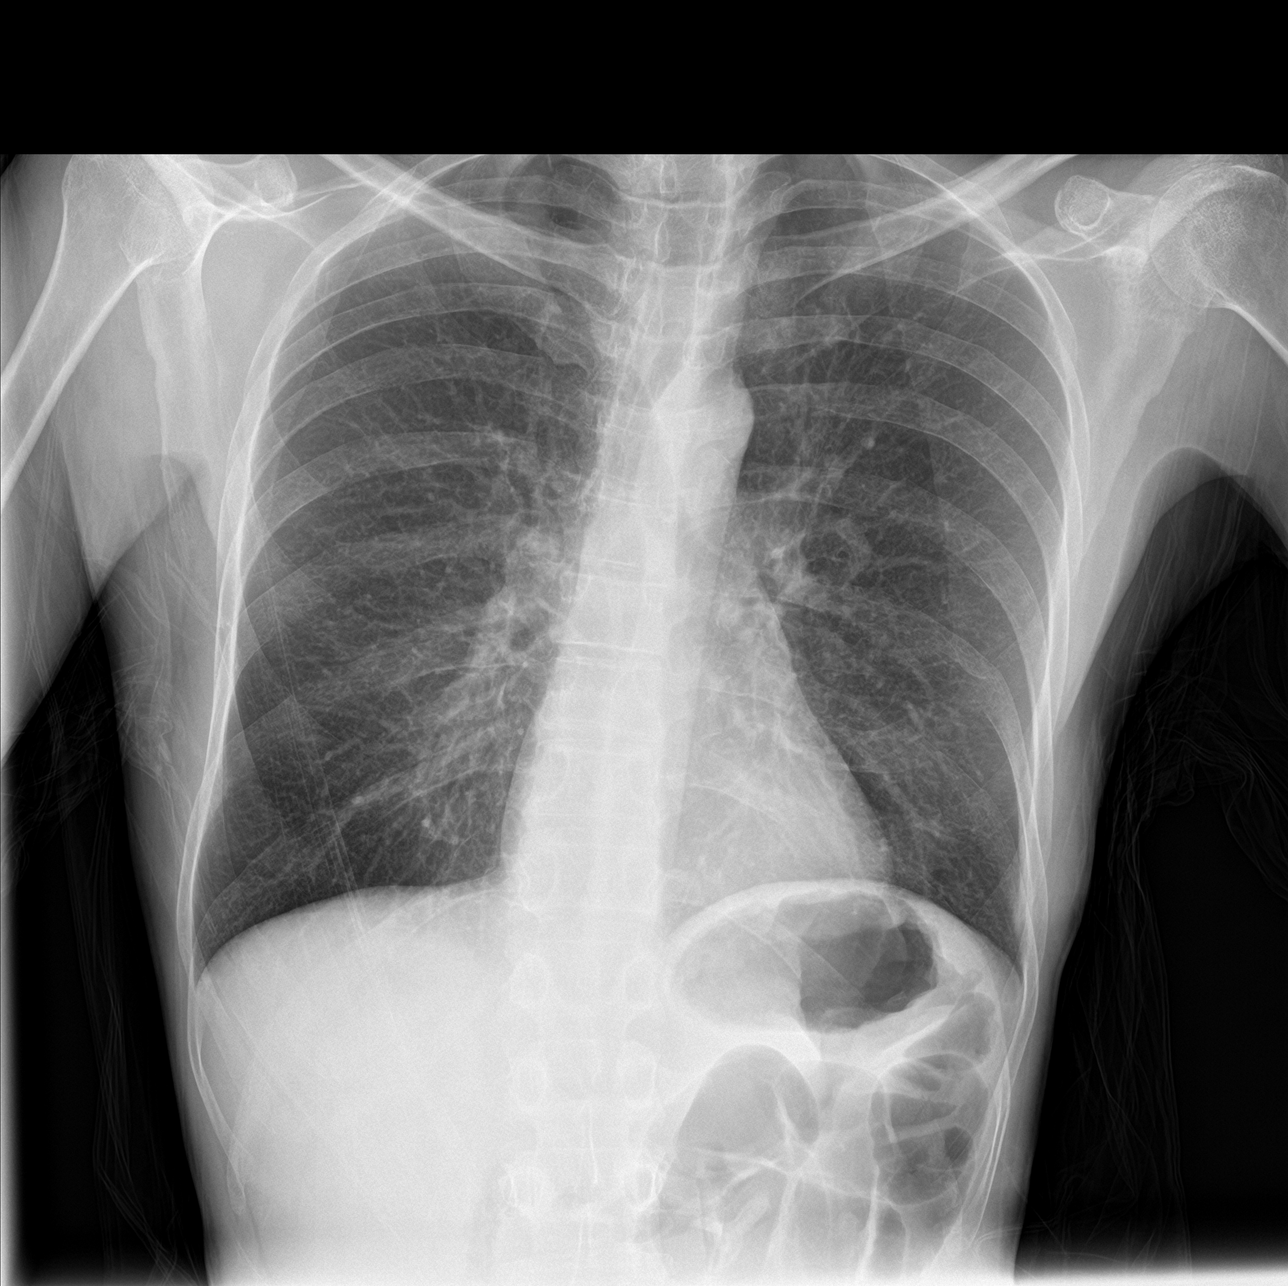

[rib pa]
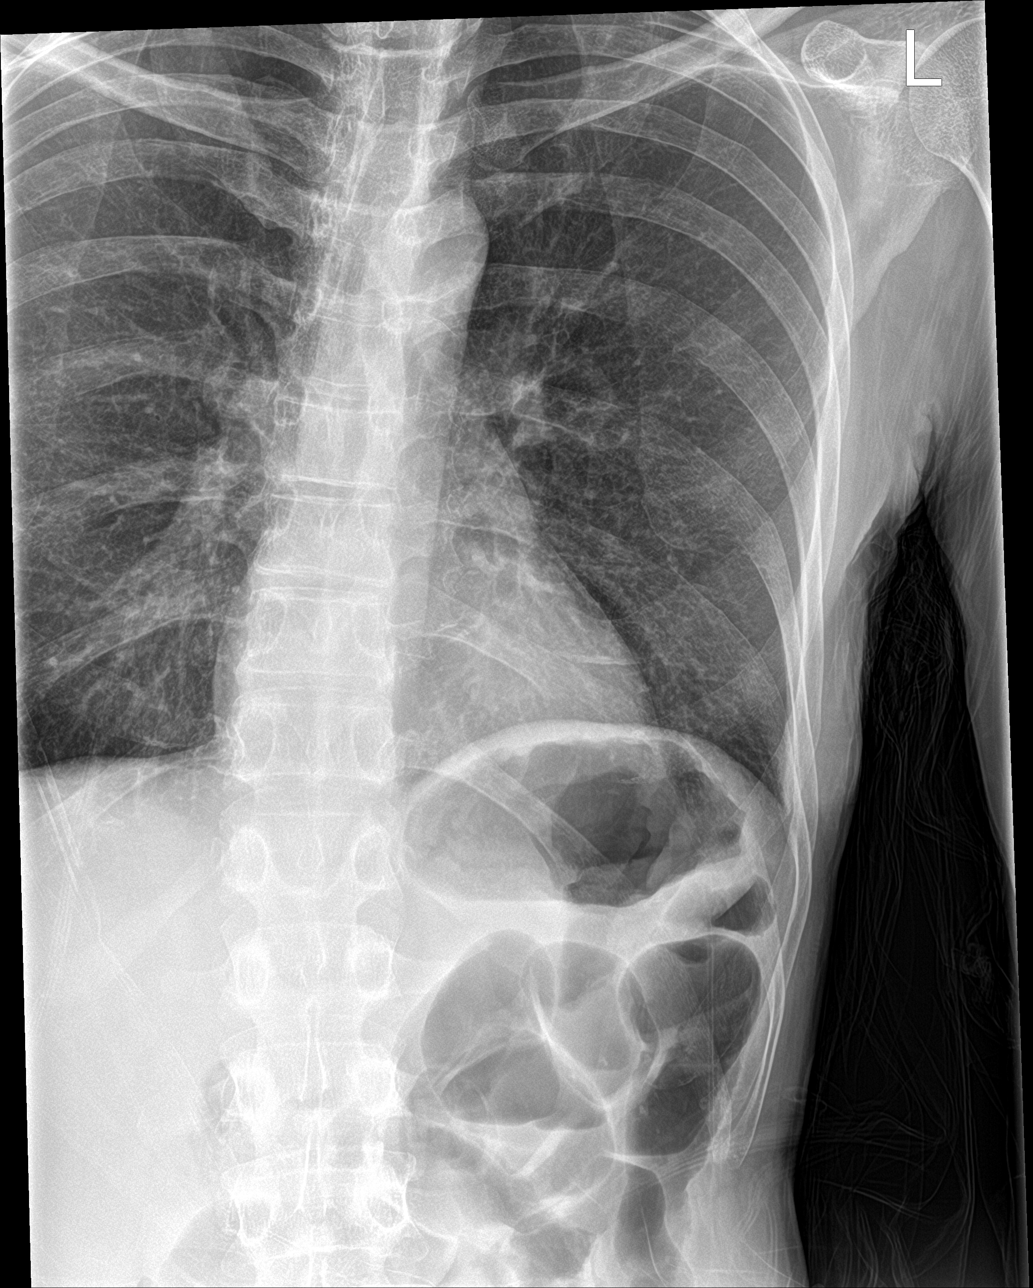

[chest ap]
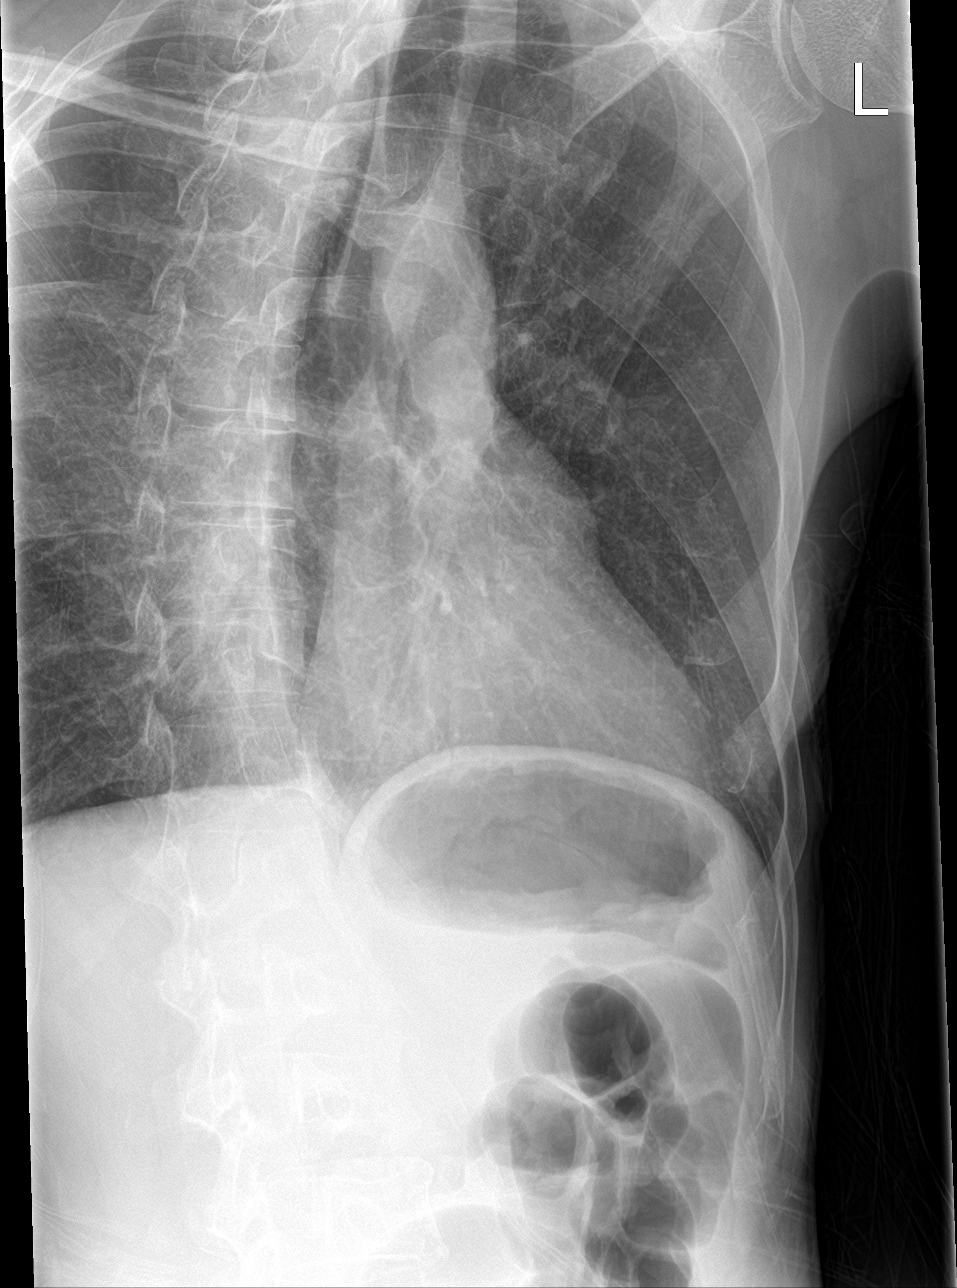

[3 of 3 positions shown; findings below may reference images not displayed]

FINDINGS: Frontal chest as well as oblique and cone-down rib images obtained.
The lungs are clear. The heart size and pulmonary vascularity are
normal. No adenopathy.

No pneumothorax or pleural effusion. There is no evident rib
fracture.
IMPRESSION: No appreciable rib fracture. No edema or consolidation. No
pneumothorax.

## 2019-02-01 ENCOUNTER — Telehealth: Payer: Self-pay | Admitting: Orthopedic Surgery

## 2019-02-01 NOTE — Telephone Encounter (Signed)
Patient called asked for a call back concerning his left ankle. Patient said his ankle is really hurting him. Patient said he want to apply for disability. Patient said he is only working 13 hours now. Patient said he is on his feet 8 hours a day. Patient said he do not have a PCP. The number to contact patient is [phone]

## 2019-02-01 NOTE — Telephone Encounter (Signed)
Pt has not been in the office since 2018 can you please call and make an appt with Junie Panning or Sharol Given we are happy to see him any time.

## 2020-11-03 ENCOUNTER — Telehealth: Payer: Self-pay | Admitting: Orthopedic Surgery

## 2020-11-03 NOTE — Telephone Encounter (Signed)
Received medical records request from patient ?

## 2020-11-04 ENCOUNTER — Telehealth: Payer: Self-pay | Admitting: Orthopedic Surgery

## 2020-11-04 ENCOUNTER — Telehealth: Payer: Self-pay | Admitting: Family

## 2020-11-04 NOTE — Telephone Encounter (Signed)
Left voicemail that CD is ready for pick up

## 2020-11-04 NOTE — Telephone Encounter (Signed)
Patient requesting xrys on CD 05/27/16 of knee & 04/09/16 Toe. Please call when ready. He will pick up. [phone]

## 2020-11-04 NOTE — Telephone Encounter (Signed)
Patient advised records are ready to be picked up and that someone in xray will call him when his xrays are ready as they were not marked on his release form.

## 2020-11-12 ENCOUNTER — Emergency Department (HOSPITAL_COMMUNITY): Payer: 59

## 2020-11-12 ENCOUNTER — Emergency Department (HOSPITAL_COMMUNITY)
Admission: EM | Admit: 2020-11-12 | Discharge: 2020-11-13 | Disposition: A | Discharge status: Home / Self Care | Payer: 59 | Attending: Emergency Medicine | Admitting: Emergency Medicine

## 2020-11-12 DIAGNOSIS — E860 Dehydration: Secondary | ICD-10-CM | POA: Diagnosis not present

## 2020-11-12 DIAGNOSIS — U071 COVID-19: Secondary | ICD-10-CM | POA: Insufficient documentation

## 2020-11-12 DIAGNOSIS — F17210 Nicotine dependence, cigarettes, uncomplicated: Secondary | ICD-10-CM | POA: Insufficient documentation

## 2020-11-12 DIAGNOSIS — Z28310 Unvaccinated for covid-19: Secondary | ICD-10-CM | POA: Diagnosis not present

## 2020-11-12 DIAGNOSIS — R55 Syncope and collapse: Secondary | ICD-10-CM | POA: Insufficient documentation

## 2020-11-12 LAB — RESP PANEL BY RT-PCR (FLU A&B, COVID) ARPGX2
Influenza A by PCR: NEGATIVE
Influenza B by PCR: NEGATIVE
SARS Coronavirus 2 by RT PCR: POSITIVE — AB

## 2020-11-12 LAB — CBC WITH DIFFERENTIAL/PLATELET
Abs Immature Granulocytes: 0.05 10*3/uL (ref 0.00–0.07)
Basophils Absolute: 0 10*3/uL (ref 0.0–0.1)
Basophils Relative: 1 %
Eosinophils Absolute: 0.1 10*3/uL (ref 0.0–0.5)
Eosinophils Relative: 2 %
HCT: 42.6 % (ref 39.0–52.0)
Hemoglobin: 15 g/dL (ref 13.0–17.0)
Immature Granulocytes: 1 %
Lymphocytes Relative: 16 %
Lymphs Abs: 1 10*3/uL (ref 0.7–4.0)
MCH: 34.9 pg — ABNORMAL HIGH (ref 26.0–34.0)
MCHC: 35.2 g/dL (ref 30.0–36.0)
MCV: 99.1 fL (ref 80.0–100.0)
Monocytes Absolute: 1 10*3/uL (ref 0.1–1.0)
Monocytes Relative: 16 %
Neutro Abs: 4 10*3/uL (ref 1.7–7.7)
Neutrophils Relative %: 64 %
Platelets: 123 10*3/uL — ABNORMAL LOW (ref 150–400)
RBC: 4.3 MIL/uL (ref 4.22–5.81)
RDW: 13.6 % (ref 11.5–15.5)
WBC: 6.2 10*3/uL (ref 4.0–10.5)
nRBC: 0 % (ref 0.0–0.2)

## 2020-11-12 LAB — COMPREHENSIVE METABOLIC PANEL
ALT: 40 U/L (ref 0–44)
AST: 184 U/L — ABNORMAL HIGH (ref 15–41)
Albumin: 3.5 g/dL (ref 3.5–5.0)
Alkaline Phosphatase: 84 U/L (ref 38–126)
Anion gap: 9 (ref 5–15)
BUN: 7 mg/dL (ref 6–20)
CO2: 25 mmol/L (ref 22–32)
Calcium: 8.8 mg/dL — ABNORMAL LOW (ref 8.9–10.3)
Chloride: 98 mmol/L (ref 98–111)
Creatinine, Ser: 1.03 mg/dL (ref 0.61–1.24)
GFR, Estimated: >60 mL/min (ref >60)
Glucose, Bld: 94 mg/dL (ref 70–99)
Potassium: 4.3 mmol/L (ref 3.5–5.1)
Sodium: 132 mmol/L — ABNORMAL LOW (ref 135–145)
Total Bilirubin: 0.7 mg/dL (ref 0.3–1.2)
Total Protein: 6.9 g/dL (ref 6.5–8.1)

## 2020-11-12 MED ORDER — SODIUM CHLORIDE 0.9 % IV BOLUS
1000.0000 mL | Freq: Once | INTRAVENOUS | Status: AC
  Administered 2020-11-12: 1000 mL via INTRAVENOUS

## 2020-11-12 MED ORDER — SODIUM CHLORIDE 0.9 % IV SOLN: INTRAVENOUS | Status: DC

## 2020-11-12 MED ORDER — NIRMATRELVIR/RITONAVIR (PAXLOVID)TABLET: 3.0000 | ORAL_TABLET | Freq: Two times a day (BID) | ORAL | 0 refills | Status: AC

## 2020-11-12 NOTE — Discharge Instructions (Addendum)
Person Under Monitoring Name: Jeff [name]  Location: [address] [place] [address]   Infection Prevention Recommendations for Individuals Confirmed to have, or Being Evaluated for, 2019 Novel Coronavirus (COVID-19) Infection Who Receive Care at Home  Individuals who are confirmed to have, or are being evaluated for, COVID-19 should follow the prevention steps below until a healthcare provider or local or state health department says they can return to normal activities.  Stay home except to get medical care You should restrict activities outside your home, except for getting medical care. Do not go to work, school, or public areas, and do not use public transportation or taxis.  Call ahead before visiting your doctor Before your medical appointment, call the healthcare provider and tell them that you have, or are being evaluated for, COVID-19 infection. This will help the healthcare provider's office take steps to keep other people from getting infected. Ask your healthcare provider to call the local or state health department.  Monitor your symptoms Seek prompt medical attention if your illness is worsening (e.g., difficulty breathing). Before going to your medical appointment, call the healthcare provider and tell them that you have, or are being evaluated for, COVID-19 infection. Ask your healthcare provider to call the local or state health department.  Wear a facemask You should wear a facemask that covers your nose and mouth when you are in the same room with other people and when you visit a healthcare provider. People who live with or visit you should also wear a facemask while they are in the same room with you.  Separate yourself from other people in your home As much as possible, you should stay in a different room from other people in your home. Also, you should use a separate bathroom, if available.  Avoid sharing household items You  should not share dishes, drinking glasses, cups, eating utensils, towels, bedding, or other items with other people in your home. After using these items, you should wash them thoroughly with soap and water.  Cover your coughs and sneezes Cover your mouth and nose with a tissue when you cough or sneeze, or you can cough or sneeze into your sleeve. Throw used tissues in a lined trash can, and immediately wash your hands with soap and water for at least 20 seconds or use an alcohol-based hand rub.  Wash your [employer] your hands often and thoroughly with soap and water for at least 20 seconds. You can use an alcohol-based hand sanitizer if soap and water are not available and if your hands are not visibly dirty. Avoid touching your eyes, nose, and mouth with unwashed hands.   Prevention Steps for Caregivers and Household Members of Individuals Confirmed to have, or Being Evaluated for, COVID-19 Infection Being Cared for in the Home  If you live with, or provide care at home for, a person confirmed to have, or being evaluated for, COVID-19 infection please follow these guidelines to prevent infection:  Follow healthcare provider's instructions Make sure that you understand and can help the patient follow any healthcare provider instructions for all care.  Provide for the patient's basic needs You should help the patient with basic needs in the home and provide support for getting groceries, prescriptions, and other personal needs.  Monitor the patient's symptoms If they are getting sicker, call his or her medical provider and tell them that the patient has, or is being evaluated for, COVID-19 infection. This will help the  healthcare provider's office take steps to keep other people from getting infected. Ask the healthcare provider to call the local or state health department.  Limit the number of people who have contact with the patient If possible, have only one caregiver for the  patient. Other household members should stay in another home or place of residence. If this is not possible, they should stay in another room, or be separated from the patient as much as possible. Use a separate bathroom, if available. Restrict visitors who do not have an essential need to be in the home.  Keep older adults, very young children, and other sick people away from the patient Keep older adults, very young children, and those who have compromised immune systems or chronic health conditions away from the patient. This includes people with chronic heart, lung, or kidney conditions, diabetes, and cancer.  Ensure good ventilation Make sure that shared spaces in the home have good air flow, such as from an air conditioner or an opened window, weather permitting.  Wash your hands often Wash your hands often and thoroughly with soap and water for at least 20 seconds. You can use an alcohol based hand sanitizer if soap and water are not available and if your hands are not visibly dirty. Avoid touching your eyes, nose, and mouth with unwashed hands. Use disposable paper towels to dry your hands. If not available, use dedicated cloth towels and replace them when they become wet.  Wear a facemask and gloves Wear a disposable facemask at all times in the room and gloves when you touch or have contact with the patient's blood, body fluids, and/or secretions or excretions, such as sweat, saliva, sputum, nasal mucus, vomit, urine, or feces.  Ensure the mask fits over your nose and mouth tightly, and do not touch it during use. Throw out disposable facemasks and gloves after using them. Do not reuse. Wash your hands immediately after removing your facemask and gloves. If your personal clothing becomes contaminated, carefully remove clothing and launder. Wash your hands after handling contaminated clothing. Place all used disposable facemasks, gloves, and other waste in a lined container before  disposing them with other household waste. Remove gloves and wash your hands immediately after handling these items.  Do not share dishes, glasses, or other household items with the patient Avoid sharing household items. You should not share dishes, drinking glasses, cups, eating utensils, towels, bedding, or other items with a patient who is confirmed to have, or being evaluated for, COVID-19 infection. After the person uses these items, you should wash them thoroughly with soap and water.  Wash laundry thoroughly Immediately remove and wash clothes or bedding that have blood, body fluids, and/or secretions or excretions, such as sweat, saliva, sputum, nasal mucus, vomit, urine, or feces, on them. Wear gloves when handling laundry from the patient. Read and follow directions on labels of laundry or clothing items and detergent. In general, wash and dry with the warmest temperatures recommended on the label.  Clean all areas the individual has used often Clean all touchable surfaces, such as counters, tabletops, doorknobs, bathroom fixtures, toilets, phones, keyboards, tablets, and bedside tables, every day. Also, clean any surfaces that may have blood, body fluids, and/or secretions or excretions on them. Wear gloves when cleaning surfaces the patient has come in contact with. Use a diluted bleach solution (e.g., dilute bleach with 1 part bleach and 10 parts water) or a household disinfectant with a label that says EPA-registered for coronaviruses. To  make a bleach solution at home, add 1 tablespoon of bleach to 1 quart (4 cups) of water. For a larger supply, add  cup of bleach to 1 gallon (16 cups) of water. Read labels of cleaning products and follow recommendations provided on product labels. Labels contain instructions for safe and effective use of the cleaning product including precautions you should take when applying the product, such as wearing gloves or eye protection and making sure you  have good ventilation during use of the product. Remove gloves and wash hands immediately after cleaning.  Monitor yourself for signs and symptoms of illness Caregivers and household members are considered close contacts, should monitor their health, and will be asked to limit movement outside of the home to the extent possible. Follow the monitoring steps for close contacts listed on the symptom monitoring form.   ? If you have additional questions, contact your local health department or call the epidemiologist on call at [phone] (available 24/7). ? This guidance is subject to change. For the most up-to-date guidance from [facility], please refer to their website: YouBlogs.pl

## 2020-11-12 NOTE — ED Triage Notes (Signed)
PT was eating while standing and began to feel as if he was going to feint.  He remembers landing on his bottom and then waking up lying down.    CBG 75 Bp 132/74 Jr 88 O2 sats 98% RA Rr 20  Given 500 ns en-route

## 2020-11-12 NOTE — ED Provider Notes (Signed)
Jeff [name] [facility] EMERGENCY DEPARTMENT Provider Note   CSN: [id number] Arrival date & time: 11/12/20  2026     History Chief Complaint  Patient presents with   Loss of Consciousness     Jeff [name] is a 60 y.o. male.  Pt presents to the ED today with a syncopal event.  Pt said that he felt like he was getting dehydrated, but did not drink any water, just soda.  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  He walks to and from work.  He was getting ready to leave for work when he started seeing black spots in his vision.  He sat down and then woke up on the floor.  He denies cp or sob.  He has felt like he's had a cold the last few days.  He has not been vaccinated against Covid.  EMS gave him 500 cc en route and he is feeling better.        No past medical history on file.  There are no problems to display for this patient.   Past Surgical History:  Procedure Laterality Date   NO PAST SURGERIES     ORIF ANKLE FRACTURE Left 01/24/2015   Procedure: OPEN REDUCTION INTERNAL FIXATION (ORIF) LEFT ANKLE FRACTURE;  Surgeon: Nadara Mustard, MD;  Location: [facility] OR;  Service: Orthopedics;  Laterality: Left;       No family history on file.  Social History   Tobacco Use   Smoking status: Every Day    Packs/day: 1.00    Years: 30.00    Pack years: 30.00    Types: Cigarettes   Smokeless tobacco: Never  Substance Use Topics   Alcohol use: Yes    Alcohol/week: 2.0 standard drinks    Types: 2 Cans of beer per week    Comment: weekends   Drug use: Yes    Types: Marijuana    Comment: 01/19/2015    Home Medications Prior to Admission medications   Medication Sig Start Date End Date Taking? Authorizing Provider  nirmatrelvir/ritonavir EUA (PAXLOVID) 20 x 150 MG & 10 x 100MG  TABS Take 3 tablets by mouth 2 (two) times daily for 5 days. Patient GFR is >60. Take nirmatrelvir (150 mg) two tablets twice daily for 5 days  and ritonavir (100 mg) one tablet twice daily for 5 days. 11/12/20 11/17/20 Yes 01/17/21, MD  Aspirin-Salicylamide-Caffeine (BC HEADACHE POWDER PO) Take 1 each by mouth as needed (for headache).    [provider]  Camphor-Menthol-Methyl Sal (HM SALONPAS PAIN RELIEF) 1.2-5.7-6.3 % PTCH Clean and dry affected area, remove patch from backing film and apply to skin. Apply one patch to the affected area and leave in place for up to 8 to 12 hours. If pain lasts after using the first patch, a second patch may be applied for up to another 8 to 12 hours. 06/08/17   Pisciotta, 06/10/17, PA-C  cetirizine (ZYRTEC) 10 MG tablet Take 1 tablet (10 mg total) by mouth daily. 06/08/17   Pisciotta, 06/10/17, PA-C  ibuprofen (ADVIL,MOTRIN) 200 MG tablet Take 400 mg by mouth every 6 (six) hours as needed for moderate pain.     [provider]  oxyCODONE-acetaminophen (PERCOCET) 5-325 MG tablet Take 1 tablet by mouth every 4 (four) hours as needed. 06/08/17   Pisciotta, 06/10/17, PA-C    Allergies    Patient has no known allergies.  Review of Systems  Review of Systems  Neurological:  Positive for syncope.  All other systems reviewed and are negative.  Physical Exam Updated Vital Signs BP 136/83 (BP Location: Left Arm)   Pulse 71   Temp 99.8 F (37.7 C) (Oral)   Resp 18   SpO2 100%   Physical Exam Vitals and nursing note reviewed.  Constitutional:      Appearance: Normal appearance. He is underweight.  HENT:     Head: Normocephalic and atraumatic.     Right Ear: External ear normal.     Left Ear: External ear normal.     Nose: Nose normal.     Mouth/Throat:     Mouth: Mucous membranes are dry.     Pharynx: Oropharynx is clear.  Eyes:     Extraocular Movements: Extraocular movements intact.     Conjunctiva/sclera: Conjunctivae normal.     Pupils: Pupils are equal, round, and reactive to light.  Cardiovascular:     Rate and Rhythm: Normal rate and regular rhythm.     Pulses: Normal  pulses.     Heart sounds: Normal heart sounds.  Pulmonary:     Effort: Pulmonary effort is normal.     Breath sounds: Normal breath sounds.  Abdominal:     General: Abdomen is flat. Bowel sounds are normal.     Palpations: Abdomen is soft.  Musculoskeletal:        General: Normal range of motion.     Cervical back: Normal range of motion and neck supple.  Skin:    General: Skin is warm.     Capillary Refill: Capillary refill takes less than 2 seconds.  Neurological:     General: No focal deficit present.     Mental Status: He is alert and oriented to person, place, and time.  Psychiatric:        Mood and Affect: Mood normal.        Behavior: Behavior normal.        Thought Content: Thought content normal.        Judgment: Judgment normal.    ED Results / Procedures / Treatments   Labs (all labs ordered are listed, but only abnormal results are displayed) Labs Reviewed  RESP PANEL BY RT-PCR (FLU A&B, COVID) ARPGX2 - Abnormal; Notable for the following components:      Result Value   SARS Coronavirus 2 by RT PCR POSITIVE (*)    All other components within normal limits  CBC WITH DIFFERENTIAL/PLATELET - Abnormal; Notable for the following components:   MCH 34.9 (*)    Platelets 123 (*)    All other components within normal limits  COMPREHENSIVE METABOLIC PANEL - Abnormal; Notable for the following components:   Sodium 132 (*)    Calcium 8.8 (*)    AST 184 (*)    All other components within normal limits  URINALYSIS, ROUTINE W REFLEX MICROSCOPIC  CBG MONITORING, ED    EKG EKG Interpretation  Date/Time:  Wednesday November 12 2020 22:20:52 EDT Ventricular Rate:  79 PR Interval:  142 QRS Duration: 68 QT Interval:  368 QTC Calculation: 421 R Axis:   71 Text Interpretation: Normal sinus rhythm Septal infarct , age undetermined Abnormal ECG Confirmed by Jacalyn Lefevre [id number]) on 11/12/2020 10:30:19 PM  Radiology DG Chest Port 1 View  Result Date: 11/12/2020 CLINICAL  DATA:  Syncope. EXAM: PORTABLE CHEST 1 VIEW COMPARISON:  06/08/2017 FINDINGS: The heart size and mediastinal contours are within normal limits. Pulmonary hyperinflation is again seen. Both lungs  are clear. The visualized skeletal structures are unremarkable. IMPRESSION: Stable exam.  Probable COPD.  No active disease. Electronically Signed   By: Danae Orleans M.D.   On: 11/12/2020 21:24    Procedures Procedures   Medications Ordered in ED Medications  sodium chloride 0.9 % bolus 1,000 mL (0 mLs Intravenous Stopped 11/12/20 2228)    And  0.9 %  sodium chloride infusion ( Intravenous New Bag/Given 11/12/20 2126)    ED Course  I have reviewed the triage vital signs and the nursing notes.  Pertinent labs & imaging results that were available during my care of the patient were reviewed by me and considered in my medical decision making (see chart for details).    MDM Rules/Calculators/A&P                           Pt is feeling much better.  He is + for Covid.  He is given a note for work.  He is interested in Paxlovid.  Pt is not hypoxic and looks good.  He is stable for d/c.  Return if worse.    Kalim L Pallone was evaluated in Emergency Department on 11/12/2020 for the symptoms described in the history of present illness. He was evaluated in the context of the global COVID-19 pandemic, which necessitated consideration that the patient might be at risk for infection with the SARS-CoV-2 virus that causes COVID-19. Institutional protocols and algorithms that pertain to the evaluation of patients at risk for COVID-19 are in a state of rapid change based on information released by regulatory bodies including the CDC and federal and state organizations. These policies and algorithms were followed during the patient's care in the ED.  Final Clinical Impression(s) / ED Diagnoses Final diagnoses:  COVID-19  Dehydration  Syncope, unspecified syncope type    Rx / DC Orders ED Discharge Orders           Ordered    nirmatrelvir/ritonavir EUA (PAXLOVID) 20 x 150 MG & 10 x 100MG  TABS  2 times daily        11/12/20 2312             2313, MD 11/12/20 2314

## 2020-11-27 ENCOUNTER — Telehealth: Payer: Self-pay

## 2020-11-27 NOTE — Telephone Encounter (Signed)
We haven't seen the pt in the office in several years would need office visit first.

## 2020-11-27 NOTE — Telephone Encounter (Signed)
Called and LVM for patient to call back regarding an appt

## 2020-11-27 NOTE — Telephone Encounter (Signed)
Pt called and would like a referral sent in for [facility] for his surgery that he had back in 2016 he said his ankle is starting to bother him.

## 2020-12-01 ENCOUNTER — Ambulatory Visit: Payer: Self-pay

## 2020-12-01 ENCOUNTER — Encounter: Payer: Self-pay | Admitting: Orthopedic Surgery

## 2020-12-01 ENCOUNTER — Ambulatory Visit (INDEPENDENT_AMBULATORY_CARE_PROVIDER_SITE_OTHER): Payer: 59 | Admitting: Orthopedic Surgery

## 2020-12-01 ENCOUNTER — Other Ambulatory Visit: Payer: Self-pay

## 2020-12-01 DIAGNOSIS — M25572 Pain in left ankle and joints of left foot: Secondary | ICD-10-CM

## 2020-12-01 NOTE — Progress Notes (Signed)
Office Visit Note   Patient: Jeff [name]           Date of Birth: 05-19-1960           MRN: [id number] Visit Date: 12/01/2020              Requested by: No referring provider defined for this encounter. PCP: Pcp, No  Chief Complaint  Patient presents with   Left Ankle - Pain      HPI: Patient is a pleasant 60 year old gentleman who is 6 years status post open reduction internal fixation of a medial malleolus fracture by Dr. Lajoyce Corners.  He was told at the time that he may develop more pain in his ankle.  He presents today because he is noticing more pain especially on the medial aspect of his ankle.  Denies much pain over the hardware itself.  He has had a lace up brace in the past which she found helpful.  He cannot take any anti-inflammatories because it makes his stomach upset.  He is still working part-time at [employer] and notices pain after he has been on his ankle for a while.  Luckily they have been able to modify his position  Assessment & Plan: Visit Diagnoses:  1. Pain in left ankle and joints of left foot     Plan: Status post ORIF medial malleolus.  I think she is developing symptoms within the ankle joint itself.  I do think he would do well with an injection but he has declined this today.  He will try using the brace as well as topical Voltaren gel.  Follow-up in 3 weeks.  Follow-Up Instructions: No follow-ups on file.   Ortho Exam  Patient is alert, oriented, no adenopathy, well-dressed, normal affect, normal respiratory effort. Left ankle: Well-healed surgical incisions.  No swelling no erythema no redness he has easily palpable dorsalis pedis pulse and posterior tibial pulse.  He is only mild tenderness over palpation of the medial malleolus and deltoid.  He does have acute tenderness when palpating the medial ankle joint.  No tenderness laterally no signs of infection or ascending cellulitis  Imaging: XR Ankle Complete Left  Result Date: 12/01/2020 X-rays of  his ankle reviewed today.  He is status post ORIF medial malleolus fracture with 2 screws in place.  Hardware is intact.  No acute osseous injuries some early arthritis of his ankle joint  No images are attached to the encounter.  Labs: No results found for: HGBA1C, ESRSEDRATE, CRP, LABURIC, REPTSTATUS, GRAMSTAIN, CULT, LABORGA   Lab Results  Component Value Date   ALBUMIN 3.5 11/12/2020   ALBUMIN 3.7 01/24/2015   ALBUMIN 4.0 04/23/2009    No results found for: MG No results found for: VD25OH  No results found for: PREALBUMIN CBC EXTENDED Latest Ref Rng & Units 11/12/2020 01/24/2015 04/23/2009  WBC 4.0 - 10.5 K/uL 6.2 6.9 7.1  RBC 4.22 - 5.81 MIL/uL 4.30 3.84(L) 3.87(L)  HGB 13.0 - 17.0 g/dL 82.9 12.7(L) 13.7  HCT 39.0 - 52.0 % 42.6 36.7(L) 39.2  PLT 150 - 400 K/uL 123(L) 232 201  NEUTROABS 1.7 - 7.7 K/uL 4.0 - 5.3  LYMPHSABS 0.7 - 4.0 K/uL 1.0 - 1.4     There is no height or weight on file to calculate BMI.  Orders:  Orders Placed This Encounter  Procedures   Ankle brace   XR Ankle Complete Left   Ambulatory referral to Physical Therapy   No orders of the defined types were  placed in this encounter.    Procedures: No procedures performed  Clinical Data: No additional findings.  ROS:  All other systems negative, except as noted in the HPI. Review of Systems  Objective: Vital Signs: There were no vitals taken for this visit.  Specialty Comments:  No specialty comments available.  PMFS History: There are no problems to display for this patient.  History reviewed. No pertinent past medical history.  History reviewed. No pertinent family history.  Past Surgical History:  Procedure Laterality Date   NO PAST SURGERIES     ORIF ANKLE FRACTURE Left 01/24/2015   Procedure: OPEN REDUCTION INTERNAL FIXATION (ORIF) LEFT ANKLE FRACTURE;  Surgeon: Nadara Mustard, MD;  Location: [facility] OR;  Service: Orthopedics;  Laterality: Left;   Social History   Occupational  History   Not on file  Tobacco Use   Smoking status: Every Day    Packs/day: 1.00    Years: 30.00    Pack years: 30.00    Types: Cigarettes   Smokeless tobacco: Never  Substance and Sexual Activity   Alcohol use: Yes    Alcohol/week: 2.0 standard drinks    Types: 2 Cans of beer per week    Comment: weekends   Drug use: Yes    Types: Marijuana    Comment: 01/19/2015   Sexual activity: Not on file

## 2020-12-22 ENCOUNTER — Ambulatory Visit: Payer: BLUE CROSS/BLUE SHIELD | Admitting: Orthopedic Surgery

## 2021-03-27 ENCOUNTER — Ambulatory Visit (HOSPITAL_COMMUNITY)
Admission: EM | Admit: 2021-03-27 | Discharge: 2021-03-27 | Disposition: A | Discharge status: Home / Self Care | Payer: 59 | Attending: Student | Admitting: Student

## 2021-03-27 ENCOUNTER — Encounter (HOSPITAL_COMMUNITY): Payer: Self-pay | Admitting: *Deleted

## 2021-03-27 ENCOUNTER — Other Ambulatory Visit: Payer: Self-pay

## 2021-03-27 DIAGNOSIS — K047 Periapical abscess without sinus: Secondary | ICD-10-CM

## 2021-03-27 MED ORDER — AMOXICILLIN-POT CLAVULANATE 875-125 MG PO TABS: 1.0000 | ORAL_TABLET | Freq: Two times a day (BID) | ORAL | 0 refills | Status: DC

## 2021-03-27 NOTE — Discharge Instructions (Addendum)
-  Start the antibiotic-Augmentin (amoxicillin-clavulanate), 1 pill every 12 hours for 7 days.  You can take this with food like with breakfast and dinner. -Follow-up with your dentist (niece) who can help with your new teeth!

## 2021-03-27 NOTE — ED Provider Notes (Signed)
[facility]    CSN: [id number] Arrival date & time: 03/27/21  7342      History   Chief Complaint Chief Complaint  Patient presents with   Sore Throat   Dental Pain    HPI Hisao L Saintjean is a 61 y.o. male presenting with left-sided dental pain and sore throat. Medical history noncontributory. Dental pain intermittently x4 weeks, left-sided. For sore throat - symptoms x2 weeks, describes as throbbing and burning and worse in the morning. Has attempted throat lozenges and theraflu without relief. Denies trouble swallowing, sensation of throat closing, SOB, CP, dizziness. Some 'chest congestion' and occ nonproductive cough. Denies foul taste in mouth, pain under tongue, pain under jaw. Family member is a Education officer, community (niece) and advised him to seek care. States niece plans to work with him to get him new teeth and is also following him for this issue.   HPI  History reviewed. No pertinent past medical history.  There are no problems to display for this patient.   Past Surgical History:  Procedure Laterality Date   NO PAST SURGERIES     ORIF ANKLE FRACTURE Left 01/24/2015   Procedure: OPEN REDUCTION INTERNAL FIXATION (ORIF) LEFT ANKLE FRACTURE;  Surgeon: Nadara Mustard, MD;  Location: [facility] OR;  Service: Orthopedics;  Laterality: Left;       Home Medications    Prior to Admission medications   Medication Sig Start Date End Date Taking? Authorizing Provider  amoxicillin-clavulanate (AUGMENTIN) 875-125 MG tablet Take 1 tablet by mouth every 12 (twelve) hours. 03/27/21  Yes Rhys Martini, PA-C  Aspirin-Salicylamide-Caffeine (BC HEADACHE POWDER PO) Take 1 each by mouth as needed (for headache).    [provider]  cetirizine (ZYRTEC) 10 MG tablet Take 1 tablet (10 mg total) by mouth daily. 06/08/17   Pisciotta, Mardella Layman    Family History History reviewed. No pertinent family history.  Social History Social History   Tobacco Use   Smoking status: Every Day     Packs/day: 1.00    Years: 30.00    Pack years: 30.00    Types: Cigarettes   Smokeless tobacco: Never  Substance Use Topics   Alcohol use: Yes    Alcohol/week: 2.0 standard drinks    Types: 2 Cans of beer per week    Comment: weekends   Drug use: Yes    Types: Marijuana    Comment: 01/19/2015     Allergies   Patient has no known allergies.   Review of Systems Review of Systems  HENT:  Positive for dental problem.   All other systems reviewed and are negative.   Physical Exam Triage Vital Signs ED Triage Vitals [03/27/21 1021]  Enc Vitals Group     BP      Pulse      Resp      Temp      Temp src      SpO2      Weight      Height      Head Circumference      Peak Flow      Pain Score 8     Pain Loc      Pain Edu?      Excl. in GC?    No data found.  Updated Vital Signs BP (!) 131/93    Pulse 92    Temp 98.4 F (36.9 C)    Resp 18    SpO2 96%   Visual Acuity Right Eye Distance:  Left Eye Distance:   Bilateral Distance:    Right Eye Near:   Left Eye Near:    Bilateral Near:     Physical Exam Vitals reviewed.  Constitutional:      General: He is not in acute distress.    Appearance: Normal appearance. He is not ill-appearing, toxic-appearing or diaphoretic.  HENT:     Head: Normocephalic and atraumatic.     Jaw: There is normal jaw occlusion. No trismus, tenderness, swelling, pain on movement or malocclusion.     Salivary Glands: Right salivary gland is not diffusely enlarged or tender. Left salivary gland is not diffusely enlarged or tender.     Right Ear: Hearing normal.     Left Ear: Hearing normal.     Nose: Nose normal.     Mouth/Throat:     Lips: Pink.     Mouth: Mucous membranes are moist. No lacerations or oral lesions.     Dentition: Abnormal dentition. Does not have dentures. Dental tenderness and dental caries present.     Tongue: No lesions. Tongue does not deviate from midline.     Palate: No mass.     Pharynx: Oropharynx is  clear. Uvula midline. No oropharyngeal exudate or posterior oropharyngeal erythema.     Tonsils: No tonsillar exudate or tonsillar abscesses.     Comments: Poor dentician  Missing many teeth  No obvious abscess but some gingival tenderness L lower molars.  Eyes:     Extraocular Movements: Extraocular movements intact.     Pupils: Pupils are equal, round, and reactive to light.  Pulmonary:     Effort: Pulmonary effort is normal.  Neurological:     General: No focal deficit present.     Mental Status: He is alert and oriented to person, place, and time.  Psychiatric:        Mood and Affect: Mood normal.        Behavior: Behavior normal.        Thought Content: Thought content normal.        Judgment: Judgment normal.     UC Treatments / Results  Labs (all labs ordered are listed, but only abnormal results are displayed) Labs Reviewed - No data to display  EKG   Radiology No results found.  Procedures Procedures (including critical care time)  Medications Ordered in UC Medications - No data to display  Initial Impression / Assessment and Plan / UC Course  I have reviewed the triage vital signs and the nursing notes.  Pertinent labs & imaging results that were available during my care of the patient were reviewed by me and considered in my medical decision making (see chart for details).     This patient is a very pleasant 61 y.o. year old male presenting with dental infection. Afebrile, nontachy. Augmentin as below, follow-up with niece who is his dentist. ED return precautions discussed. Patient verbalizes understanding and agreement.   .   Final Clinical Impressions(s) / UC Diagnoses   Final diagnoses:  Dental infection     Discharge Instructions      -Start the antibiotic-Augmentin (amoxicillin-clavulanate), 1 pill every 12 hours for 7 days.  You can take this with food like with breakfast and dinner. -Follow-up with your dentist (niece) who can help with  your new teeth!     ED Prescriptions     Medication Sig Dispense Auth. Provider   amoxicillin-clavulanate (AUGMENTIN) 875-125 MG tablet Take 1 tablet by mouth every 12 (twelve) hours. 14 tablet  Rhys MartiniGraham, Marselino Slayton E, PA-C      PDMP not reviewed this encounter.   Rhys MartiniGraham, Abilene Mcphee E, PA-C 03/27/21 1044

## 2021-03-27 NOTE — ED Triage Notes (Signed)
Pt reports he has dental pain on Lt and a sore throat.

## 2022-02-23 ENCOUNTER — Telehealth: Payer: Self-pay

## 2022-02-23 NOTE — Telephone Encounter (Signed)
Patient called triage line complaining of ankle pain with bloody drainage where he had ankle surgery in 2016. I offered him an appointment today and he states he does not have transportation or money. I told him we could bill him and he states he would rather have the money/co-pay at time of visit. I told him to keep wound clean and dry until we see him on Thursday

## 2022-02-25 ENCOUNTER — Ambulatory Visit (INDEPENDENT_AMBULATORY_CARE_PROVIDER_SITE_OTHER): Payer: Self-pay

## 2022-02-25 ENCOUNTER — Ambulatory Visit (INDEPENDENT_AMBULATORY_CARE_PROVIDER_SITE_OTHER): Payer: Self-pay | Admitting: Orthopedic Surgery

## 2022-02-25 DIAGNOSIS — M25572 Pain in left ankle and joints of left foot: Secondary | ICD-10-CM

## 2022-02-25 DIAGNOSIS — M12572 Traumatic arthropathy, left ankle and foot: Secondary | ICD-10-CM

## 2022-02-28 ENCOUNTER — Encounter: Payer: Self-pay | Admitting: Orthopedic Surgery

## 2022-02-28 NOTE — Progress Notes (Signed)
Office Visit Note   Patient: Jeff [name]           Date of Birth: [birth year]           MRN: [id number] Visit Date: 02/25/2022              Requested by: No referring provider defined for this encounter. PCP: Pcp, No  Chief Complaint  Patient presents with   Left Ankle - Pain    Hx ORIF left ankle 2016      HPI: Patient is a 61 year old gentleman who is status post open duction internal fixation left ankle in 2016.  Patient states he has pain with ambulation uses a cane.  Patient states he has had difficulty at work for the past month with swelling.  Assessment & Plan: Visit Diagnoses:  1. Pain in left ankle and joints of left foot   2. Traumatic arthritis of left ankle     Plan: Patient was provided a prescription to return to work on December 18.  Will provide an ankle stabilizing orthosis and recommended Voltaren gel.  Follow-Up Instructions: No follow-ups on file.   Ortho Exam  Patient is alert, oriented, no adenopathy, well-dressed, normal affect, normal respiratory effort. Examination patient has dorsiflexion of the ankle to neutral.  He has palpable pulses he has no pain to palpation of the posterior tibial tendon or peroneal tendons.  Resisted inversion and eversion are stable.  Patient has pain to palpation over the anterior joint line.  Imaging: No results found. No images are attached to the encounter.  Labs: No results found for: "HGBA1C", "ESRSEDRATE", "CRP", "LABURIC", "REPTSTATUS", "GRAMSTAIN", "CULT", "LABORGA"   Lab Results  Component Value Date   ALBUMIN 3.5 11/12/2020   ALBUMIN 3.7 01/24/2015   ALBUMIN 4.0 04/23/2009    No results found for: "MG" No results found for: "VD25OH"  No results found for: "PREALBUMIN"    Latest Ref Rng & Units 11/12/2020    9:15 PM 01/24/2015    3:12 PM 04/23/2009    8:56 AM  CBC EXTENDED  WBC 4.0 - 10.5 K/uL 6.2  6.9  7.1   RBC 4.22 - 5.81 MIL/uL 4.30  3.84  3.87   Hemoglobin 13.0 - 17.0 g/dL 40.9  81.1   91.4   HCT 39.0 - 52.0 % 42.6  36.7  39.2   Platelets 150 - 400 K/uL 123  232  201   NEUT# 1.7 - 7.7 K/uL 4.0   5.3   Lymph# 0.7 - 4.0 K/uL 1.0   1.4      There is no height or weight on file to calculate BMI.  Orders:  Orders Placed This Encounter  Procedures   XR Ankle Complete Left   No orders of the defined types were placed in this encounter.    Procedures: No procedures performed  Clinical Data: No additional findings.  ROS:  All other systems negative, except as noted in the HPI. Review of Systems  Objective: Vital Signs: There were no vitals taken for this visit.  Specialty Comments:  No specialty comments available.  PMFS History: There are no problems to display for this patient.  History reviewed. No pertinent past medical history.  History reviewed. No pertinent family history.  Past Surgical History:  Procedure Laterality Date   NO PAST SURGERIES     ORIF ANKLE FRACTURE Left 01/24/2015   Procedure: OPEN REDUCTION INTERNAL FIXATION (ORIF) LEFT ANKLE FRACTURE;  Surgeon: Nadara Mustard, MD;  Location: [facility];  Service: Orthopedics;  Laterality: Left;   Social History   Occupational History   Not on file  Tobacco Use   Smoking status: Every Day    Packs/day: 1.00    Years: 30.00    Total pack years: 30.00    Types: Cigarettes   Smokeless tobacco: Never  Substance and Sexual Activity   Alcohol use: Yes    Alcohol/week: 2.0 standard drinks of alcohol    Types: 2 Cans of beer per week    Comment: weekends   Drug use: Yes    Types: Marijuana    Comment: 01/19/2015   Sexual activity: Not on file

## 2022-03-05 ENCOUNTER — Ambulatory Visit (HOSPITAL_COMMUNITY)
Admission: EM | Admit: 2022-03-05 | Discharge: 2022-03-05 | Disposition: A | Discharge status: Home / Self Care | Payer: No Typology Code available for payment source | Attending: Internal Medicine | Admitting: Internal Medicine

## 2022-03-05 ENCOUNTER — Encounter (HOSPITAL_COMMUNITY): Payer: Self-pay | Admitting: Emergency Medicine

## 2022-03-05 DIAGNOSIS — R6884 Jaw pain: Secondary | ICD-10-CM

## 2022-03-05 DIAGNOSIS — K0510 Chronic gingivitis, plaque induced: Secondary | ICD-10-CM | POA: Diagnosis not present

## 2022-03-05 DIAGNOSIS — K047 Periapical abscess without sinus: Secondary | ICD-10-CM

## 2022-03-05 MED ORDER — AMOXICILLIN-POT CLAVULANATE 875-125 MG PO TABS: 1.0000 | ORAL_TABLET | Freq: Two times a day (BID) | ORAL | 0 refills | Status: DC

## 2022-03-05 MED ORDER — IBUPROFEN 800 MG PO TABS
ORAL_TABLET | ORAL | Status: AC
  Filled 2022-03-05: qty 1

## 2022-03-05 MED ORDER — IBUPROFEN 800 MG PO TABS
800.0000 mg | ORAL_TABLET | Freq: Once | ORAL | Status: AC
  Administered 2022-03-05: 800 mg via ORAL

## 2022-03-05 NOTE — ED Provider Notes (Signed)
[facility]    CSN: [id number] Arrival date & time: 03/05/22  1525      History   Chief Complaint Chief Complaint  Patient presents with   Jaw Pain   Sore Throat    HPI Jeff [name] is a 61 y.o. male.   Patient presents urgent care for evaluation of left-sided jaw pain and intermittent itchy left ear for the last 2 months.  Patient was seen in January 2023 with similar symptoms and provided Augmentin antibiotic.  He has not had any antibiotics since then and has not had any work on his mouth at the dentist since then either.  Left jaw pain is a "burning sensation" to the gums.  Multiple missing teeth and poor dentition reported.  He states his niece is a Pharmacist, community in [facility] and has been attempting to get him to come to [place] for dentures but he has not had the ability to make it to [place] due to lack of funding and transportation.  Denies fever, chills, decreased oral intake, nausea, vomiting, abdominal pain, sore throat, chest pain, viral URI symptoms, heart palpitations, and dizziness. Denies dental abscess and drainage from mouth. No swelling, warmth, or erythema to the face. He has been placing BC goody powders to the left lower gum line and states this helps significantly. His niece has repeatedly told him that this is not a good idea, but he states this is the "only thing that helps the pain".  He is a current everyday cigarette smoker and smokes approximately 1 pack of cigarettes per day.   Sore Throat    History reviewed. No pertinent past medical history.  There are no problems to display for this patient.   Past Surgical History:  Procedure Laterality Date   NO PAST SURGERIES     ORIF ANKLE FRACTURE Left 01/24/2015   Procedure: OPEN REDUCTION INTERNAL FIXATION (ORIF) LEFT ANKLE FRACTURE;  Surgeon: Newt Minion, MD;  Location: [facility];  Service: Orthopedics;  Laterality: Left;       Home Medications    Prior to Admission  medications   Medication Sig Start Date End Date Taking? Authorizing Provider  amoxicillin-clavulanate (AUGMENTIN) 875-125 MG tablet Take 1 tablet by mouth every 12 (twelve) hours. 03/05/22   Talbot Grumbling, FNP  Aspirin-Salicylamide-Caffeine (BC HEADACHE POWDER PO) Take 1 each by mouth as needed (for headache).    [provider]  cetirizine (ZYRTEC) 10 MG tablet Take 1 tablet (10 mg total) by mouth daily. 06/08/17   Pisciotta, Charna Elizabeth    Family History No family history on file.  Social History Social History   Tobacco Use   Smoking status: Every Day    Packs/day: 1.00    Years: 30.00    Total pack years: 30.00    Types: Cigarettes   Smokeless tobacco: Never  Substance Use Topics   Alcohol use: Yes    Alcohol/week: 2.0 standard drinks of alcohol    Types: 2 Cans of beer per week    Comment: weekends   Drug use: Yes    Types: Marijuana    Comment: 01/19/2015     Allergies   Patient has no known allergies.   Review of Systems Review of Systems Per HPI  Physical Exam Triage Vital Signs ED Triage Vitals  Enc Vitals Group     BP 03/05/22 1705 125/79     Pulse Rate 03/05/22 1705 97     Resp 03/05/22 1705 15  Temp 03/05/22 1705 98.3 F (36.8 C)     Temp Source 03/05/22 1705 Oral     SpO2 03/05/22 1705 97 %     Weight --      Height --      Head Circumference --      Peak Flow --      Pain Score 03/05/22 1702 9     Pain Loc --      Pain Edu? --      Excl. in [place]? --    No data found.  Updated Vital Signs BP 125/79 (BP Location: Left Arm)   Pulse 97   Temp 98.3 F (36.8 C) (Oral)   Resp 15   SpO2 97%   Visual Acuity Right Eye Distance:   Left Eye Distance:   Bilateral Distance:    Right Eye Near:   Left Eye Near:    Bilateral Near:     Physical Exam Vitals and nursing note reviewed.  Constitutional:      Appearance: He is not ill-appearing or toxic-appearing.  HENT:     Head: Normocephalic and atraumatic.     Right Ear:  Hearing, tympanic membrane, ear canal and external ear normal. Tympanic membrane is not erythematous.     Left Ear: Hearing, tympanic membrane, ear canal and external ear normal. Tympanic membrane is not erythematous.     Nose: No congestion or rhinorrhea.     Mouth/Throat:     Lips: Pink.     Mouth: Mucous membranes are moist.     Dentition: Abnormal dentition. Does not have dentures. Dental tenderness and gingival swelling present.     Palate: No mass and lesions.     Pharynx: Uvula midline. Posterior oropharyngeal erythema present. No uvula swelling.     Tonsils: No tonsillar exudate or tonsillar abscesses. 0 on the right. 0 on the left.     Comments: Multiple missing teeth and significantly poor dentition.  Tenderness to palpation of the left lower gumline.  No evidence of abscess, drainage, or ulceration. There is swelling and erythema to the area of tenderness.  No buccal swelling or muffled voice sounds. Eyes:     General: Lids are normal. Vision grossly intact. Gaze aligned appropriately.     Extraocular Movements: Extraocular movements intact.     Conjunctiva/sclera: Conjunctivae normal.  Cardiovascular:     Rate and Rhythm: Normal rate and regular rhythm.     Heart sounds: Normal heart sounds, S1 normal and S2 normal.  Pulmonary:     Effort: Pulmonary effort is normal. No respiratory distress.     Breath sounds: Normal breath sounds and air entry.  Abdominal:     General: Bowel sounds are normal.     Palpations: Abdomen is soft.     Tenderness: There is no abdominal tenderness.  Musculoskeletal:     Cervical back: Normal range of motion and neck supple.  Lymphadenopathy:     Cervical: Cervical adenopathy present.  Skin:    General: Skin is warm and dry.     Capillary Refill: Capillary refill takes less than 2 seconds.     Findings: No rash.  Neurological:     General: No focal deficit present.     Mental Status: He is alert and oriented to person, place, and time. Mental  status is at baseline.     Cranial Nerves: No dysarthria or facial asymmetry.  Psychiatric:        Mood and Affect: Mood normal.  Speech: Speech normal.        Behavior: Behavior normal.        Thought Content: Thought content normal.        Judgment: Judgment normal.      UC Treatments / Results  Labs (all labs ordered are listed, but only abnormal results are displayed) Labs Reviewed - No data to display  EKG   Radiology No results found.  Procedures Procedures (including critical care time)  Medications Ordered in UC Medications - No data to display  Initial Impression / Assessment and Plan / UC Course  I have reviewed the triage vital signs and the nursing notes.  Pertinent labs & imaging results that were available during my care of the patient were reviewed by me and considered in my medical decision making (see chart for details).   1.  Pain in lower jaw, dental infection, gingivitis Presentation is consistent with acute gingivitis and dental infection.  Patient to take Augmentin twice daily for the next 7 days.  Advised to perform salt water gargles to reduce swelling and irritation.  Patient given 800 mg ibuprofen in clinic.  Discussed that he is not allowed to use BC powders tonight if he takes the 800 mg of ibuprofen in the clinic.  Nursing staff reiterated this to patient.  Discussed significant risk for GI bleeding if he should continue taking high doses of NSAIDs together.  Advised to call his niece who is a dentist to schedule an appointment to have dental work done and gingivitis reevaluated.  List of community dentists in the area provided should he be unable to obtain transportation to [place] to see his niece.  Strict ER return precautions given.  Discussed physical exam and available lab work findings in clinic with patient.  Counseled patient regarding appropriate use of medications and potential side effects for all medications recommended or  prescribed today. Discussed red flag signs and symptoms of worsening condition,when to call the PCP office, return to urgent care, and when to seek higher level of care in the emergency department. Patient verbalizes understanding and agreement with plan. All questions answered. Patient discharged in stable condition.    Final Clinical Impressions(s) / UC Diagnoses   Final diagnoses:  Pain in lower jaw     Discharge Instructions      Take Augmentin twice daily for the next 7 days to treat your dental infection. Apply ice to the outside of your face to reduce inflammation and pain. Continue use of EITHER ibuprofen 600 mg every 6 hours as needed with food OR BC powders for dental inflammation and pain.  You may also take 1000 mg of Tylenol every 6 hours as needed with this for breakthrough pain.  Schedule an appointment with one of the dentist on the list provided to urgent care today.  If you develop any new or worsening symptoms or do not improve in the next 2 to 3 days, please return.  If your symptoms are severe, please go to the emergency room.  Follow-up with your primary care provider for further evaluation and management of your symptoms as well as ongoing wellness visits.  I hope you feel better!    ED Prescriptions     Medication Sig Dispense Auth. Provider   amoxicillin-clavulanate (AUGMENTIN) 875-125 MG tablet Take 1 tablet by mouth every 12 (twelve) hours. 14 tablet Carlisle Beers, FNP      PDMP not reviewed this encounter.   Carlisle Beers, [place] 03/05/22 1824

## 2022-03-05 NOTE — ED Triage Notes (Signed)
Pt c/o left jaw and throat pain for about 2 months. Taking BC powder for pain, reports works sometimes.

## 2022-03-05 NOTE — Discharge Instructions (Signed)
Take Augmentin twice daily for the next 7 days to treat your dental infection. Apply ice to the outside of your face to reduce inflammation and pain. Continue use of EITHER ibuprofen 600 mg every 6 hours as needed with food OR BC powders for dental inflammation and pain.  You may also take 1000 mg of Tylenol every 6 hours as needed with this for breakthrough pain.  Schedule an appointment with one of the dentist on the list provided to urgent care today.  If you develop any new or worsening symptoms or do not improve in the next 2 to 3 days, please return.  If your symptoms are severe, please go to the emergency room.  Follow-up with your primary care provider for further evaluation and management of your symptoms as well as ongoing wellness visits.  I hope you feel better!

## 2022-04-27 ENCOUNTER — Telehealth: Payer: Self-pay | Admitting: Orthopedic Surgery

## 2022-04-27 NOTE — Telephone Encounter (Signed)
Patient called asked if he can get the same medicine that he was given when he had his surgery? Patient said he could not figure out what he was given. Patient said it could have been aspirin or advil. Patient said he was given a lot of the medication.  Patient said he is in a lot of pain. The number to contact patient is [phone]

## 2022-05-03 NOTE — Telephone Encounter (Signed)
Can you please call this pt and see if he wants an appt to come in and see Erin at some point. We can not give pain medication. Needs eval.

## 2022-09-03 ENCOUNTER — Ambulatory Visit (HOSPITAL_COMMUNITY)
Admission: EM | Admit: 2022-09-03 | Discharge: 2022-09-03 | Disposition: A | Discharge status: Home / Self Care | Payer: No Typology Code available for payment source

## 2022-09-03 ENCOUNTER — Encounter (HOSPITAL_COMMUNITY): Payer: Self-pay | Admitting: Emergency Medicine

## 2022-09-03 ENCOUNTER — Other Ambulatory Visit: Payer: Self-pay

## 2022-09-03 DIAGNOSIS — K0500 Acute gingivitis, plaque induced: Secondary | ICD-10-CM | POA: Diagnosis not present

## 2022-09-03 DIAGNOSIS — K047 Periapical abscess without sinus: Secondary | ICD-10-CM | POA: Diagnosis not present

## 2022-09-03 MED ORDER — AMOXICILLIN-POT CLAVULANATE 875-125 MG PO TABS: 1.0000 | ORAL_TABLET | Freq: Two times a day (BID) | ORAL | 0 refills | Status: DC

## 2022-09-03 NOTE — ED Provider Notes (Addendum)
[facility]    CSN: [id number] Arrival date & time: 09/03/22  1707      History   Chief Complaint No chief complaint on file.   HPI Jeff [name] is a 62 y.o. male.   Patient presents to urgent care for evaluation of left lower jaw pain and swelling that started a couple of months ago but worsened over the last 4-5 days. He has a history of dental infections to this area of the mouth and is missing several teeth to this area. Reports it is difficult to chew his food and therefore difficult to swallow due to dental pain. No fevers, chills, noticeable dental abscess, or drainage to the mouth. States his sister is a Education officer, community and has attempted to fit him for dentures in the past but is unable to do so while there is an infection. He plans on calling sister and having her evaluate mouth for possible tooth extraction/dentures. He has been taking tylenol and ibuprofen as needed with some relief of pain. No recent antibiotics in the last 3 months. Denies history of immunosuppression.      History reviewed. No pertinent past medical history.  There are no problems to display for this patient.   Past Surgical History:  Procedure Laterality Date   NO PAST SURGERIES     ORIF ANKLE FRACTURE Left 01/24/2015   Procedure: OPEN REDUCTION INTERNAL FIXATION (ORIF) LEFT ANKLE FRACTURE;  Surgeon: Nadara Mustard, MD;  Location[facility];  Service: Orthopedics;  Laterality: Left;       Home Medications    Prior to Admission medications   Medication Sig Start Date End Date Taking? Authorizing Provider  ibuprofen (ADVIL) 200 MG tablet Take 200 mg by mouth every 6 (six) hours as needed.   Yes [provider]  amoxicillin-clavulanate (AUGMENTIN) 875-125 MG tablet Take 1 tablet by mouth every 12 (twelve) hours. 09/03/22  Yes StanhopeDonavan Burnet, FNP  Aspirin-Salicylamide-Caffeine (BC HEADACHE POWDER PO) Take 1 each by mouth as needed (for headache). Patient not taking: Reported on  09/03/2022    [provider]  cetirizine (ZYRTEC) 10 MG tablet Take 1 tablet (10 mg total) by mouth daily. Patient not taking: Reported on 09/03/2022 06/08/17   Pisciotta, Mardella Layman    Family History History reviewed. No pertinent family history.  Social History Social History   Tobacco Use   Smoking status: Every Day    Packs/day: 1.00    Years: 30.00    Additional pack years: 0.00    Total pack years: 30.00    Types: Cigarettes   Smokeless tobacco: Never  Vaping Use   Vaping Use: Never used  Substance Use Topics   Alcohol use: Yes    Alcohol/week: 2.0 standard drinks of alcohol    Types: 2 Cans of beer per week    Comment: weekends   Drug use: Yes    Types: Marijuana    Comment: 01/19/2015     Allergies   Patient has no known allergies.   Review of Systems Review of Systems Per HPI  Physical Exam Triage Vital Signs ED Triage Vitals  Enc Vitals Group     BP 09/03/22 1752 135/86     Pulse Rate 09/03/22 1752 83     Resp 09/03/22 1752 20     Temp 09/03/22 1752 98.1 F (36.7 C)     Temp Source 09/03/22 1752 Oral     SpO2 09/03/22 1752 96 %     Weight --  Height --      Head Circumference --      Peak Flow --      Pain Score 09/03/22 1749 10     Pain Loc --      Pain Edu? --      Excl. in [place]? --    No data found.  Updated Vital Signs BP 135/86 (BP Location: Right Arm)   Pulse 83   Temp 98.1 F (36.7 C) (Oral)   Resp 20   SpO2 96%   Visual Acuity Right Eye Distance:   Left Eye Distance:   Bilateral Distance:    Right Eye Near:   Left Eye Near:    Bilateral Near:     Physical Exam Vitals and nursing note reviewed.  Constitutional:      Appearance: He is not ill-appearing or toxic-appearing.  HENT:     Head: Normocephalic and atraumatic.     Right Ear: Hearing and external ear normal.     Left Ear: Hearing and external ear normal.     Nose: Nose normal.     Mouth/Throat:     Lips: Pink.     Mouth: Mucous membranes are  moist. No injury.     Dentition: Abnormal dentition. Dental tenderness and gingival swelling present. No dental abscesses.     Tongue: No lesions. Tongue does not deviate from midline.     Palate: No mass and lesions.     Pharynx: Oropharynx is clear. Uvula midline. No pharyngeal swelling, oropharyngeal exudate, posterior oropharyngeal erythema or uvula swelling.     Tonsils: No tonsillar exudate or tonsillar abscesses.     Comments: Missing most teeth to the upper mouth. Missing multiple teeth to the lower mouth and the left lower gumline. Swelling and dental tenderness to the left lower gumline.  Eyes:     General: Lids are normal. Vision grossly intact. Gaze aligned appropriately.     Extraocular Movements: Extraocular movements intact.     Conjunctiva/sclera: Conjunctivae normal.  Cardiovascular:     Rate and Rhythm: Normal rate and regular rhythm.     Heart sounds: Normal heart sounds, S1 normal and S2 normal.  Pulmonary:     Effort: Pulmonary effort is normal. No respiratory distress.     Breath sounds: Normal breath sounds and air entry.  Musculoskeletal:     Cervical back: Neck supple.  Skin:    General: Skin is warm and dry.     Capillary Refill: Capillary refill takes less than 2 seconds.     Findings: No rash.  Neurological:     General: No focal deficit present.     Mental Status: He is alert and oriented to person, place, and time. Mental status is at baseline.     Cranial Nerves: No dysarthria or facial asymmetry.  Psychiatric:        Mood and Affect: Mood normal.        Speech: Speech normal.        Behavior: Behavior normal.        Thought Content: Thought content normal.        Judgment: Judgment normal.      UC Treatments / Results  Labs (all labs ordered are listed, but only abnormal results are displayed) Labs Reviewed - No data to display  EKG   Radiology No results found.  Procedures Procedures (including critical care time)  Medications  Ordered in UC Medications - No data to display  Initial Impression / Assessment and Plan /  UC Course  I have reviewed the triage vital signs and the nursing notes.  Pertinent labs & imaging results that were available during my care of the patient were reviewed by me and considered in my medical decision making (see chart for details).   1. Acute gingivitis, dental infection Presentation consistent with dental infection. Will manage this with Augmentin antibiotic and over the counter medications as needed for pain and inflammation/swelling. Patient is afebrile, nontoxic in appearance, and with hemodynamically stable vital signs. HEENT exam stable. Information for low cost community dental resources provided. Encouraged to schedule appointment for follow-up for dental work to be performed.   Discussed red flag signs and symptoms of worsening condition,when to call the PCP office, return to urgent care, and when to seek higher level of care in the emergency department. Counseled patient regarding appropriate use of medications and potential side effects for all medications recommended or prescribed today. Patient verbalizes understanding and agreement with plan. Discharged in stable condition.     Final Clinical Impressions(s) / UC Diagnoses   Final diagnoses:  Gingivitis, acute  Dental infection     Discharge Instructions      Your dental pain is likely due to dental infection. Take Augmentin antibiotic as prescribed for the next 7 days to treat your dental infection. Continue use of ibuprofen 800 mg every 8 hours as needed with food for dental inflammation and pain.  You may also take 1000 mg of Tylenol every 6 hours as needed with this for breakthrough pain. Perform salt water gargles every 3-4 hours.  Schedule an appointment with one of the dentists on the list provided to urgent care today.  If you develop any new or worsening symptoms or do not improve in the next 2 to 3 days,  please return.  If your symptoms are severe, please go to the emergency room.  Follow-up with your primary care provider for further evaluation and management of your symptoms as well as ongoing wellness visits.  I hope you feel better!   ED Prescriptions     Medication Sig Dispense Auth. Provider   amoxicillin-clavulanate (AUGMENTIN) 875-125 MG tablet Take 1 tablet by mouth every 12 (twelve) hours. 14 tablet Carlisle Beers, FNP      PDMP not reviewed this encounter.   Carlisle Beers, FNP 09/03/22 1831    Carlisle Beers, FNP 09/03/22 1831

## 2022-09-03 NOTE — ED Triage Notes (Addendum)
Feels like food gets stuck in throat.  Gums are painful, especially on left side of mouth.  Patient reports this has been going on for 2 months.    Swallowing liquids Jeff [name] makes him cough

## 2022-09-03 NOTE — Discharge Instructions (Signed)
Your dental pain is likely due to dental infection. Take Augmentin antibiotic as prescribed for the next 7 days to treat your dental infection. Continue use of ibuprofen 800 mg every 8 hours as needed with food for dental inflammation and pain.  You may also take 1000 mg of Tylenol every 6 hours as needed with this for breakthrough pain. Perform salt water gargles every 3-4 hours.  Schedule an appointment with one of the dentists on the list provided to urgent care today.  If you develop any new or worsening symptoms or do not improve in the next 2 to 3 days, please return.  If your symptoms are severe, please go to the emergency room.  Follow-up with your primary care provider for further evaluation and management of your symptoms as well as ongoing wellness visits.  I hope you feel better! 

## 2022-09-26 ENCOUNTER — Encounter (HOSPITAL_COMMUNITY): Payer: Self-pay

## 2022-09-26 ENCOUNTER — Other Ambulatory Visit: Payer: Self-pay

## 2022-09-26 ENCOUNTER — Emergency Department (HOSPITAL_COMMUNITY): Payer: No Typology Code available for payment source

## 2022-09-26 ENCOUNTER — Emergency Department (HOSPITAL_COMMUNITY)
Admission: EM | Admit: 2022-09-26 | Discharge: 2022-09-26 | Disposition: A | Discharge status: Home / Self Care | Payer: No Typology Code available for payment source | Attending: Emergency Medicine | Admitting: Emergency Medicine

## 2022-09-26 DIAGNOSIS — K148 Other diseases of tongue: Secondary | ICD-10-CM | POA: Insufficient documentation

## 2022-09-26 DIAGNOSIS — R590 Localized enlarged lymph nodes: Secondary | ICD-10-CM | POA: Diagnosis not present

## 2022-09-26 DIAGNOSIS — R1310 Dysphagia, unspecified: Secondary | ICD-10-CM | POA: Diagnosis present

## 2022-09-26 LAB — COMPREHENSIVE METABOLIC PANEL
ALT: 7 U/L (ref 0–44)
AST: 25 U/L (ref 15–41)
Albumin: 3.5 g/dL (ref 3.5–5.0)
Alkaline Phosphatase: 73 U/L (ref 38–126)
Anion gap: 12 (ref 5–15)
BUN: 6 mg/dL — ABNORMAL LOW (ref 8–23)
CO2: 24 mmol/L (ref 22–32)
Calcium: 9.1 mg/dL (ref 8.9–10.3)
Chloride: 100 mmol/L (ref 98–111)
Creatinine, Ser: 0.86 mg/dL (ref 0.61–1.24)
GFR, Estimated: >60 mL/min (ref >60)
Glucose, Bld: 118 mg/dL — ABNORMAL HIGH (ref 70–99)
Potassium: 3.4 mmol/L — ABNORMAL LOW (ref 3.5–5.1)
Sodium: 136 mmol/L (ref 135–145)
Total Bilirubin: 0.4 mg/dL (ref 0.3–1.2)
Total Protein: 6.8 g/dL (ref 6.5–8.1)

## 2022-09-26 LAB — CBC WITH DIFFERENTIAL/PLATELET
Abs Immature Granulocytes: 0.01 10*3/uL (ref 0.00–0.07)
Basophils Absolute: 0.1 10*3/uL (ref 0.0–0.1)
Basophils Relative: 1 %
Eosinophils Absolute: 0.2 10*3/uL (ref 0.0–0.5)
Eosinophils Relative: 3 %
HCT: 37.4 % — ABNORMAL LOW (ref 39.0–52.0)
Hemoglobin: 12.9 g/dL — ABNORMAL LOW (ref 13.0–17.0)
Immature Granulocytes: 0 %
Lymphocytes Relative: 36 %
Lymphs Abs: 2.6 10*3/uL (ref 0.7–4.0)
MCH: 33.1 pg (ref 26.0–34.0)
MCHC: 34.5 g/dL (ref 30.0–36.0)
MCV: 95.9 fL (ref 80.0–100.0)
Monocytes Absolute: 0.8 10*3/uL (ref 0.1–1.0)
Monocytes Relative: 11 %
Neutro Abs: 3.4 10*3/uL (ref 1.7–7.7)
Neutrophils Relative %: 49 %
Platelets: 276 10*3/uL (ref 150–400)
RBC: 3.9 MIL/uL — ABNORMAL LOW (ref 4.22–5.81)
RDW: 13.2 % (ref 11.5–15.5)
WBC: 7.1 10*3/uL (ref 4.0–10.5)
nRBC: 0 % (ref 0.0–0.2)

## 2022-09-26 MED ORDER — GLUCAGON HCL RDNA (DIAGNOSTIC) 1 MG IJ SOLR
1.0000 mg | Freq: Once | INTRAMUSCULAR | Status: AC
  Administered 2022-09-26: 1 mg via INTRAVENOUS
  Filled 2022-09-26: qty 1

## 2022-09-26 MED ORDER — FAMOTIDINE IN NACL 20-0.9 MG/50ML-% IV SOLN
20.0000 mg | Freq: Once | INTRAVENOUS | Status: AC
  Administered 2022-09-26: 20 mg via INTRAVENOUS
  Filled 2022-09-26: qty 50

## 2022-09-26 MED ORDER — ALUM & MAG HYDROXIDE-SIMETH 200-200-20 MG/5ML PO SUSP
30.0000 mL | Freq: Once | ORAL | Status: AC
  Administered 2022-09-26: 30 mL via ORAL
  Filled 2022-09-26: qty 30

## 2022-09-26 MED ORDER — SODIUM CHLORIDE 0.9 % IV BOLUS
1000.0000 mL | Freq: Once | INTRAVENOUS | Status: AC
Start: 2022-09-26 — End: 2022-09-26
  Administered 2022-09-26: 1000 mL via INTRAVENOUS

## 2022-09-26 MED ORDER — DIPHENHYDRAMINE HCL 50 MG/ML IJ SOLN
25.0000 mg | Freq: Once | INTRAMUSCULAR | Status: AC
  Administered 2022-09-26: 25 mg via INTRAVENOUS
  Filled 2022-09-26: qty 1

## 2022-09-26 MED ORDER — METOCLOPRAMIDE HCL 5 MG/ML IJ SOLN
10.0000 mg | Freq: Once | INTRAMUSCULAR | Status: AC
  Administered 2022-09-26: 10 mg via INTRAVENOUS
  Filled 2022-09-26: qty 2

## 2022-09-26 MED ORDER — IOHEXOL 350 MG/ML SOLN
75.0000 mL | Freq: Once | INTRAVENOUS | Status: AC | PRN
  Administered 2022-09-26: 75 mL via INTRAVENOUS

## 2022-09-26 NOTE — ED Notes (Signed)
Patient returned to room from CT. 

## 2022-09-26 NOTE — ED Notes (Signed)
Patient transported to CT 

## 2022-09-26 NOTE — ED Provider Notes (Signed)
Jeff [name]   CSN: [id number] Arrival date & time: 09/26/22  1823     History  Chief Complaint  Patient presents with   throat issues    Jeff [name] is a 61 y.o. male history of poor dentition here presenting with trouble swallowing.  Patient states that she has family member who is a Education officer, community.  He states that he has poor dentition and was put on 2 weeks of Augmentin.  He states that he finished Augmentin about a week ago.  He states that since then he noticed he has some trouble swallowing.  He states whenever he is swallow any food, he needs to drink some water and it takes a while for him to swallow the food.  He states that he is able to drink water with no problem.  He denies any dental pain currently.  The history is provided by the patient.       Home Medications Prior to Admission medications   Medication Sig Start Date End Date Taking? Authorizing Provider  amoxicillin-clavulanate (AUGMENTIN) 875-125 MG tablet Take 1 tablet by mouth every 12 (twelve) hours. 09/03/22   Carlisle Beers, FNP  Aspirin-Salicylamide-Caffeine (BC HEADACHE POWDER PO) Take 1 each by mouth as needed (for headache). Patient not taking: Reported on 09/03/2022    [provider]  cetirizine (ZYRTEC) 10 MG tablet Take 1 tablet (10 mg total) by mouth daily. Patient not taking: Reported on 09/03/2022 06/08/17   Pisciotta, Joni Reining, PA-C  ibuprofen (ADVIL) 200 MG tablet Take 200 mg by mouth every 6 (six) hours as needed.    [provider]      Allergies    Patient has no known allergies.    Review of Systems   Review of Systems  HENT:  Positive for trouble swallowing.   All other systems reviewed and are negative.   Physical Exam Updated Vital Signs BP (!) 140/88 (BP Location: Right Arm)   Pulse 94   Temp 99.1 F (37.3 C) (Oral)   Resp 14   Ht 5\' 7"  (1.702 m)   Wt 55 kg   SpO2 97%   BMI 18.99 kg/m  Physical  Exam Vitals and nursing [name] reviewed.  Constitutional:      Appearance: Normal appearance.     Comments: Chronically ill  HENT:     Head: Normocephalic.     Mouth/Throat:     Comments: Patient has poor dentition overall.  Missing multiple teeth.  Patient already has 4 teeth on the bottom left.  Patient has no obvious periapical abscess Eyes:     Extraocular Movements: Extraocular movements intact.  Neck:     Comments: Mild bilateral cervical lymphadenopathy.  No obvious thyromegaly.  No masses appreciated Cardiovascular:     Pulses: Normal pulses.  Pulmonary:     Effort: Pulmonary effort is normal.     Breath sounds: Normal breath sounds.  Abdominal:     General: Abdomen is flat.     Palpations: Abdomen is soft.  Musculoskeletal:        General: Normal range of motion.  Skin:    General: Skin is warm.     Capillary Refill: Capillary refill takes less than 2 seconds.  Neurological:     General: No focal deficit present.     Mental Status: He is alert.  Psychiatric:        Mood and Affect: Mood normal.        Behavior:  Behavior normal.     ED Results / Procedures / Treatments   Labs (all labs ordered are listed, but only abnormal results are displayed) Labs Reviewed  CBC WITH DIFFERENTIAL/PLATELET  COMPREHENSIVE METABOLIC PANEL    EKG None  Radiology No results found.  Procedures Procedures    Medications Ordered in ED Medications  metoCLOPramide (REGLAN) injection 10 mg (has no administration in time range)  diphenhydrAMINE (BENADRYL) injection 25 mg (has no administration in time range)  sodium chloride 0.9 % bolus 1,000 mL (has no administration in time range)  alum & mag hydroxide-simeth (MAALOX/MYLANTA) 200-200-20 MG/5ML suspension 30 mL (has no administration in time range)  glucagon (human recombinant) (GLUCAGEN) injection 1 mg (has no administration in time range)  famotidine (PEPCID) IVPB 20 mg premix (has no administration in time range)    ED  Course/ Medical Decision Making/ A&P                             Medical Decision Making Jeff [name] is a 62 y.o. male here presenting with trouble swallowing.  Consider retropharyngeal abscess versus throat mass.  Plan to get CBC and CMP and CT neck  10:33 PM CT showed ulcerated mass in the base of the tongue.  This is likely causing his symptoms.  Patient also has multiple malignant lymph nodes on the CT scan.  Will refer to ENT outpatient for direct laryngoscopy and further workup.  Problems Addressed: Tongue mass: acute illness or injury  Amount and/or Complexity of Data Reviewed Labs: ordered. Decision-making details documented in ED Course. Radiology: ordered and independent interpretation performed. Decision-making details documented in ED Course.  Risk OTC drugs. Prescription drug management.    Final Clinical Impression(s) / ED Diagnoses Final diagnoses:  None    Rx / DC Orders ED Discharge Orders     None         Charlynne Pander, MD 09/26/22 2233

## 2022-09-26 NOTE — Discharge Instructions (Signed)
You have a tongue mass that is likely contributing to your symptoms  You need to call Dr. Lucky Rathke office for further evaluation  Return to ER if you have worse trouble swallowing, severe pain, dehydration

## 2022-09-26 NOTE — ED Triage Notes (Signed)
Patient came with complaints of swallowing issues. He states that everything he eats comes out of his nose. Patient has recently gone to the dentist that put him on antibiotics which he finished about 2 weeks ago and when he finished the antibiotic the swallowing issues started. Patient reports a change in voice, and coughing up spit.

## 2022-09-26 NOTE — ED Notes (Signed)
Patient wanting to go outside, educated him that if he went outside he may be discharged and then would have to repeat the process of coming through the emergency room again which will take more time. Patient frustrated with how long he has been here. RN talked to patient and he is back in his room sitting on his bed.

## 2022-09-28 DIAGNOSIS — R1310 Dysphagia, unspecified: Secondary | ICD-10-CM

## 2022-09-28 DIAGNOSIS — F17200 Nicotine dependence, unspecified, uncomplicated: Secondary | ICD-10-CM | POA: Insufficient documentation

## 2022-10-03 ENCOUNTER — Encounter (HOSPITAL_COMMUNITY): Payer: Self-pay

## 2022-10-03 ENCOUNTER — Ambulatory Visit (HOSPITAL_COMMUNITY)
Admission: EM | Admit: 2022-10-03 | Discharge: 2022-10-03 | Disposition: A | Discharge status: Home / Self Care | Payer: No Typology Code available for payment source | Attending: Family Medicine | Admitting: Family Medicine

## 2022-10-03 DIAGNOSIS — K0510 Chronic gingivitis, plaque induced: Secondary | ICD-10-CM | POA: Diagnosis not present

## 2022-10-03 MED ORDER — OXYCODONE HCL 5 MG PO TABS: 5.0000 mg | ORAL_TABLET | ORAL | 0 refills | Status: AC | PRN

## 2022-10-03 MED ORDER — OXYCODONE HCL 5 MG PO TABS: 5.0000 mg | ORAL_TABLET | ORAL | 0 refills | Status: DC | PRN

## 2022-10-03 MED ORDER — CHLORHEXIDINE GLUCONATE 0.12 % MT SOLN: 15.0000 mL | Freq: Two times a day (BID) | OROMUCOSAL | 0 refills | Status: DC

## 2022-10-03 NOTE — Discharge Instructions (Addendum)
It was nice seeing you today. I am sorry about your mouth pain. I sent to the pharmacy a mouth wash to help treat mild infection as well as pain medication for a few days. Please follow up with your dentist and primary care provider soon.

## 2022-10-03 NOTE — ED Provider Notes (Addendum)
[facility]    CSN: [id number] Arrival date & time: 10/03/22  1127      History   Chief Complaint Chief Complaint  Patient presents with   Oral Swelling    HPI Jeff [name] is a 62 y.o. male.  Mouth pain: Jaw/Gum pain: The patient presents with pain in his gum radiating to his jaw B/L worse on the left lower jaw. Pain now is 7/10  in severity but may worsen with eating. He was seen recently for acute gingivitis, for which he was treated with antibiotics. He also recently saw an ENT for a tongue mass and was started on Diflucan for a fungal infection. He has been taking Ibuprofen multiple times a day and requested a different treatment option for his pain. He said he has an appointment with his dentist in [place] next week.  No fever. Pain is worse with eating, sharp and naging pain. Wants some mouth wash The history is provided by the patient. No language interpreter was used.    History reviewed. No pertinent past medical history.  There are no problems to display for this patient.   Past Surgical History:  Procedure Laterality Date   NO PAST SURGERIES     ORIF ANKLE FRACTURE Left 01/24/2015   Procedure: OPEN REDUCTION INTERNAL FIXATION (ORIF) LEFT ANKLE FRACTURE;  Surgeon: Nadara Mustard, MD;  Location: [facility] OR;  Service: Orthopedics;  Laterality: Left;       Home Medications    Prior to Admission medications   Medication Sig Start Date End Date Taking? Authorizing Provider  fluconazole (DIFLUCAN) 100 MG tablet Take by mouth. 09/28/22 10/05/22 Yes [provider]  amoxicillin-clavulanate (AUGMENTIN) 875-125 MG tablet Take 1 tablet by mouth every 12 (twelve) hours. 09/03/22   Carlisle Beers, FNP  Aspirin-Salicylamide-Caffeine (BC HEADACHE POWDER PO) Take 1 each by mouth as needed (for headache). Patient not taking: Reported on 09/03/2022    [provider]  cetirizine (ZYRTEC) 10 MG tablet Take 1 tablet (10 mg total) by mouth  daily. Patient not taking: Reported on 09/03/2022 06/08/17   Pisciotta, Joni Reining, PA-C  ibuprofen (ADVIL) 200 MG tablet Take 200 mg by mouth every 6 (six) hours as needed.    [provider]    Family History History reviewed. No pertinent family history.  Social History Social History   Tobacco Use   Smoking status: Every Day    Current packs/day: 1.00    Average packs/day: 1 pack/day for 30.0 years (30.0 ttl pk-yrs)    Types: Cigarettes   Smokeless tobacco: Never  Vaping Use   Vaping status: Never Used  Substance Use Topics   Alcohol use: Yes    Alcohol/week: 2.0 standard drinks of alcohol    Types: 2 Cans of beer per week    Comment: weekends   Drug use: Yes    Types: Marijuana    Comment: 01/19/2015     Allergies   Patient has no known allergies.   Review of Systems Review of Systems  All other systems reviewed and are negative.    Physical Exam Triage Vital Signs ED Triage Vitals  Encounter Vitals Group     BP 10/03/22 1155 (!) 145/94     Systolic BP Percentile --      Diastolic BP Percentile --      Pulse Rate 10/03/22 1156 95     Resp 10/03/22 1155 16     Temp 10/03/22 1156 98.1 F (36.7 C)  Temp Source 10/03/22 1156 Oral     SpO2 10/03/22 1155 96 %     Weight --      Height --      Head Circumference --      Peak Flow --      Pain Score --      Pain Loc --      Pain Education --      Exclude from Growth Chart --    No data found.  Updated Vital Signs BP (!) 145/94 (BP Location: Left Arm)   Pulse 95   Temp 98.1 F (36.7 C) (Oral)   Resp 16   SpO2 96%   Visual Acuity Right Eye Distance:   Left Eye Distance:   Bilateral Distance:    Right Eye Near:   Left Eye Near:    Bilateral Near:     Physical Exam Vitals and nursing note reviewed.  HENT:     Head:     Jaw: There is normal jaw occlusion.     Comments: Multiple missing teeth. No gum swelling, or erythema. Yellowish white patch on his tongue. No jaw swelling or  tenderness Cardiovascular:     Rate and Rhythm: Normal rate and regular rhythm.     Pulses: Normal pulses.     Heart sounds: No murmur heard. Pulmonary:     Effort: Pulmonary effort is normal.     Breath sounds: Normal breath sounds. No stridor. No wheezing.      UC Treatments / Results  Labs (all labs ordered are listed, but only abnormal results are displayed) Labs Reviewed - No data to display  EKG   Radiology No results found.  Procedures Procedures (including critical care time)  Medications Ordered in UC Medications - No data to display  Initial Impression / Assessment and Plan / UC Course  I have reviewed the triage vital signs and the nursing notes.  Pertinent labs & imaging results that were available during my care of the patient were reviewed by me and considered in my medical decision making (see chart for details).     Gingivitis with dental problem: Chlorhexidine mouth wash escribed with Oxycodone x 7 days. F/U dentist as planned.  NB: Initial 30 days supply of oxycodone was canceled. I spoke with the pharmacist [facility]) who confirmed cancellation.   BP elevated during this visit, may be due to pain. His repeat BP was 127/86 F/U with PCP Final Clinical Impressions(s) / UC Diagnoses   Final diagnoses:  None   Discharge Instructions   None    ED Prescriptions   None    PDMP not reviewed this encounter.   Doreene Eland, MD 10/03/22 1237    Doreene Eland, MD 10/03/22 1240

## 2022-10-03 NOTE — ED Triage Notes (Signed)
Pt is here for mouth soreness x 2 weeks. Pt is requesting magic mouth wash.

## 2022-10-07 ENCOUNTER — Other Ambulatory Visit (HOSPITAL_COMMUNITY): Payer: Self-pay | Admitting: Otolaryngology

## 2022-10-07 DIAGNOSIS — R1314 Dysphagia, pharyngoesophageal phase: Secondary | ICD-10-CM

## 2022-10-07 DIAGNOSIS — J392 Other diseases of pharynx: Secondary | ICD-10-CM

## 2022-10-11 ENCOUNTER — Other Ambulatory Visit (HOSPITAL_COMMUNITY): Payer: Self-pay | Admitting: Otolaryngology

## 2022-10-11 ENCOUNTER — Ambulatory Visit (HOSPITAL_COMMUNITY)
Admission: RE | Admit: 2022-10-11 | Discharge: 2022-10-11 | Disposition: A | Discharge status: Home / Self Care | Payer: No Typology Code available for payment source | Source: Ambulatory Visit | Attending: Otolaryngology | Admitting: Otolaryngology

## 2022-10-11 DIAGNOSIS — J392 Other diseases of pharynx: Secondary | ICD-10-CM

## 2022-10-11 DIAGNOSIS — R1314 Dysphagia, pharyngoesophageal phase: Secondary | ICD-10-CM | POA: Diagnosis present

## 2022-10-13 ENCOUNTER — Ambulatory Visit (HOSPITAL_COMMUNITY): Admission: RE | Admit: 2022-10-13 | Payer: No Typology Code available for payment source | Source: Ambulatory Visit

## 2022-11-01 ENCOUNTER — Ambulatory Visit (HOSPITAL_COMMUNITY): Admit: 2022-11-01 | Payer: No Typology Code available for payment source | Admitting: Otolaryngology

## 2022-11-01 SURGERY — LARYNGOSCOPY, DIRECT
Anesthesia: General

## 2022-11-10 DIAGNOSIS — C01 Malignant neoplasm of base of tongue: Secondary | ICD-10-CM | POA: Diagnosis present

## 2022-11-11 DIAGNOSIS — R638 Other symptoms and signs concerning food and fluid intake: Secondary | ICD-10-CM | POA: Insufficient documentation

## 2022-11-12 DIAGNOSIS — E43 Unspecified severe protein-calorie malnutrition: Secondary | ICD-10-CM | POA: Diagnosis present

## 2022-11-12 DIAGNOSIS — C029 Malignant neoplasm of tongue, unspecified: Secondary | ICD-10-CM | POA: Insufficient documentation

## 2022-11-12 DIAGNOSIS — R64 Cachexia: Secondary | ICD-10-CM | POA: Diagnosis present

## 2022-11-13 DIAGNOSIS — D509 Iron deficiency anemia, unspecified: Secondary | ICD-10-CM | POA: Insufficient documentation

## 2022-11-28 ENCOUNTER — Emergency Department (HOSPITAL_COMMUNITY)
Admission: EM | Admit: 2022-11-28 | Discharge: 2022-11-28 | Discharge status: Against Medical Advice | Payer: No Typology Code available for payment source | Attending: Emergency Medicine | Admitting: Emergency Medicine

## 2022-11-28 ENCOUNTER — Encounter (HOSPITAL_COMMUNITY): Payer: Self-pay | Admitting: Emergency Medicine

## 2022-11-28 ENCOUNTER — Other Ambulatory Visit: Payer: Self-pay

## 2022-11-28 DIAGNOSIS — R6884 Jaw pain: Secondary | ICD-10-CM | POA: Insufficient documentation

## 2022-11-28 DIAGNOSIS — Z85810 Personal history of malignant neoplasm of tongue: Secondary | ICD-10-CM | POA: Insufficient documentation

## 2022-11-28 DIAGNOSIS — Z5321 Procedure and treatment not carried out due to patient leaving prior to being seen by health care provider: Secondary | ICD-10-CM | POA: Insufficient documentation

## 2022-11-28 NOTE — ED Notes (Signed)
Patient stated that he is leaving because his sister called the script into a different pharmacy and she has an uber coming for him to go get the medication.  Patient seen leaving with a steady gait.

## 2022-11-28 NOTE — ED Triage Notes (Signed)
Patient BIB [facility] c/o tongue/jaw pain and being out of pain meds.  Patient reports the pharmacy has been unable to fill his script and he's been out for 5 days.  Patient has a history of tongue cancer.

## 2022-12-08 DIAGNOSIS — K0889 Other specified disorders of teeth and supporting structures: Secondary | ICD-10-CM | POA: Diagnosis not present

## 2022-12-14 DIAGNOSIS — Z419 Encounter for procedure for purposes other than remedying health state, unspecified: Secondary | ICD-10-CM | POA: Diagnosis not present

## 2022-12-15 DIAGNOSIS — Z7689 Persons encountering health services in other specified circumstances: Secondary | ICD-10-CM | POA: Diagnosis not present

## 2022-12-23 ENCOUNTER — Emergency Department (HOSPITAL_COMMUNITY): Payer: No Typology Code available for payment source

## 2022-12-23 ENCOUNTER — Other Ambulatory Visit: Payer: Self-pay

## 2022-12-23 ENCOUNTER — Encounter (HOSPITAL_COMMUNITY): Payer: Self-pay

## 2022-12-23 ENCOUNTER — Observation Stay (HOSPITAL_COMMUNITY)
Admission: EM | Admit: 2022-12-23 | Discharge: 2022-12-25 | Disposition: A | Discharge status: Home / Self Care | Payer: No Typology Code available for payment source | Attending: Internal Medicine | Admitting: Internal Medicine

## 2022-12-23 DIAGNOSIS — K9413 Enterostomy malfunction: Secondary | ICD-10-CM | POA: Diagnosis present

## 2022-12-23 DIAGNOSIS — E860 Dehydration: Principal | ICD-10-CM | POA: Diagnosis present

## 2022-12-23 DIAGNOSIS — C01 Malignant neoplasm of base of tongue: Secondary | ICD-10-CM | POA: Diagnosis present

## 2022-12-23 DIAGNOSIS — Z79899 Other long term (current) drug therapy: Secondary | ICD-10-CM

## 2022-12-23 DIAGNOSIS — K9423 Gastrostomy malfunction: Secondary | ICD-10-CM | POA: Diagnosis not present

## 2022-12-23 DIAGNOSIS — E874 Mixed disorder of acid-base balance: Secondary | ICD-10-CM | POA: Diagnosis present

## 2022-12-23 DIAGNOSIS — E43 Unspecified severe protein-calorie malnutrition: Secondary | ICD-10-CM | POA: Diagnosis not present

## 2022-12-23 DIAGNOSIS — Z681 Body mass index (BMI) 19 or less, adult: Secondary | ICD-10-CM

## 2022-12-23 DIAGNOSIS — R64 Cachexia: Secondary | ICD-10-CM | POA: Diagnosis present

## 2022-12-23 DIAGNOSIS — E872 Acidosis, unspecified: Secondary | ICD-10-CM | POA: Diagnosis not present

## 2022-12-23 DIAGNOSIS — E873 Alkalosis: Secondary | ICD-10-CM | POA: Diagnosis present

## 2022-12-23 DIAGNOSIS — G893 Neoplasm related pain (acute) (chronic): Secondary | ICD-10-CM | POA: Diagnosis present

## 2022-12-23 DIAGNOSIS — D630 Anemia in neoplastic disease: Secondary | ICD-10-CM | POA: Diagnosis present

## 2022-12-23 DIAGNOSIS — R1310 Dysphagia, unspecified: Secondary | ICD-10-CM | POA: Diagnosis not present

## 2022-12-23 DIAGNOSIS — K5903 Drug induced constipation: Secondary | ICD-10-CM | POA: Diagnosis not present

## 2022-12-23 DIAGNOSIS — K5900 Constipation, unspecified: Secondary | ICD-10-CM | POA: Diagnosis present

## 2022-12-23 DIAGNOSIS — F17210 Nicotine dependence, cigarettes, uncomplicated: Secondary | ICD-10-CM | POA: Diagnosis present

## 2022-12-23 DIAGNOSIS — I959 Hypotension, unspecified: Secondary | ICD-10-CM | POA: Diagnosis present

## 2022-12-23 LAB — CBC WITH DIFFERENTIAL/PLATELET
Abs Immature Granulocytes: 0.01 10*3/uL (ref 0.00–0.07)
Basophils Absolute: 0.1 10*3/uL (ref 0.0–0.1)
Basophils Relative: 1 %
Eosinophils Absolute: 0 10*3/uL (ref 0.0–0.5)
Eosinophils Relative: 0 %
HCT: 37.2 % — ABNORMAL LOW (ref 39.0–52.0)
Hemoglobin: 11.9 g/dL — ABNORMAL LOW (ref 13.0–17.0)
Immature Granulocytes: 0 %
Lymphocytes Relative: 27 %
Lymphs Abs: 2.3 10*3/uL (ref 0.7–4.0)
MCH: 30.1 pg (ref 26.0–34.0)
MCHC: 32 g/dL (ref 30.0–36.0)
MCV: 93.9 fL (ref 80.0–100.0)
Monocytes Absolute: 0.9 10*3/uL (ref 0.1–1.0)
Monocytes Relative: 10 %
Neutro Abs: 5.3 10*3/uL (ref 1.7–7.7)
Neutrophils Relative %: 62 %
Platelets: 299 10*3/uL (ref 150–400)
RBC: 3.96 MIL/uL — ABNORMAL LOW (ref 4.22–5.81)
RDW: 12.7 % (ref 11.5–15.5)
WBC: 8.6 10*3/uL (ref 4.0–10.5)
nRBC: 0 % (ref 0.0–0.2)

## 2022-12-23 LAB — COMPREHENSIVE METABOLIC PANEL
ALT: 10 U/L (ref 0–44)
AST: 22 U/L (ref 15–41)
Albumin: 4 g/dL (ref 3.5–5.0)
Alkaline Phosphatase: 78 U/L (ref 38–126)
Anion gap: 15 (ref 5–15)
BUN: 24 mg/dL — ABNORMAL HIGH (ref 8–23)
CO2: 37 mmol/L — ABNORMAL HIGH (ref 22–32)
Calcium: 10.6 mg/dL — ABNORMAL HIGH (ref 8.9–10.3)
Chloride: 87 mmol/L — ABNORMAL LOW (ref 98–111)
Creatinine, Ser: 0.94 mg/dL (ref 0.61–1.24)
GFR, Estimated: >60 mL/min (ref >60)
Glucose, Bld: 112 mg/dL — ABNORMAL HIGH (ref 70–99)
Potassium: 3.8 mmol/L (ref 3.5–5.1)
Sodium: 139 mmol/L (ref 135–145)
Total Bilirubin: 0.6 mg/dL (ref 0.3–1.2)
Total Protein: 9 g/dL — ABNORMAL HIGH (ref 6.5–8.1)

## 2022-12-23 LAB — I-STAT CG4 LACTIC ACID, ED: Lactic Acid, Venous: 3.3 mmol/L (ref 0.5–1.9)

## 2022-12-23 LAB — LIPASE, BLOOD: Lipase: 19 U/L (ref 11–51)

## 2022-12-23 MED ORDER — BISACODYL 10 MG RE SUPP: 10.0000 mg | Freq: Once | RECTAL | Status: DC

## 2022-12-23 MED ORDER — IOHEXOL 350 MG/ML SOLN
75.0000 mL | Freq: Once | INTRAVENOUS | Status: AC | PRN
  Administered 2022-12-23: 75 mL via INTRAVENOUS

## 2022-12-23 MED ORDER — LACTATED RINGERS IV BOLUS
1000.0000 mL | Freq: Once | INTRAVENOUS | Status: AC
  Administered 2022-12-23: 1000 mL via INTRAVENOUS

## 2022-12-23 NOTE — ED Provider Notes (Signed)
[place] [facility] AT [facility] Provider Note   CSN: [id number] Arrival date & time: 12/23/22  1507     History  Chief Complaint  Patient presents with   feeding tube dislodged    Feeding tube dislodged    Jeff [name] is a 62 y.o. male with PMH as listed below who presents with leaking feeding tube. States since yesterday he has been trying to use G tube with normal feeds but everything that goes in seems to leak out around the opening of the tube through the skin. No h/o similar. No abdominal pain, nausea/vomiting, f/c. Also endorses constipation and hasn't had a bowel movement in two days. Constantly trying and feeling like he needs to but cannot. No h/o similar either. No h/o bowel obstruction. Has tongue cancer and doesn't take anything by mouth. H/o malnutrition as well.  Supposed to start chemo next week. [facility] on 12/19/22. On arrival to the ED patient is hypotensive 88 systolic.    History reviewed. No pertinent past medical history.     Home Medications Prior to Admission medications   Medication Sig Start Date End Date Taking? Authorizing Provider  amoxicillin-clavulanate (AUGMENTIN) 875-125 MG tablet Take 1 tablet by mouth every 12 (twelve) hours. 09/03/22   Carlisle Beers, FNP  Aspirin-Salicylamide-Caffeine (BC HEADACHE POWDER PO) Take 1 each by mouth as needed (for headache). Patient not taking: Reported on 09/03/2022    [provider]  cetirizine (ZYRTEC) 10 MG tablet Take 1 tablet (10 mg total) by mouth daily. Patient not taking: Reported on 09/03/2022 06/08/17   Pisciotta, Joni Reining, PA-C  chlorhexidine (PERIDEX) 0.12 % solution Use as directed 15 mLs in the mouth or throat 2 (two) times daily. 10/03/22   Doreene Eland, MD  ibuprofen (ADVIL) 200 MG tablet Take 200 mg by mouth every 6 (six) hours as needed.    [provider]      Allergies    Patient has no known allergies.     Review of Systems   Review of Systems A 10 point review of systems was performed and is negative unless otherwise reported in HPI.  Physical Exam Updated Vital Signs BP (!) 88/65 (BP Location: Left Arm)   Pulse 93   Temp 98 F (36.7 C) (Oral)   Resp 16   Ht 5\' 9"  (1.753 m)   Wt 39.5 kg   SpO2 100%   BMI 12.85 kg/m  Physical Exam General: Cachectic chronically ill-appearing male, lying in bed.  HEENT: PERRLA, Sclera anicteric, dry mucous membranes, trachea midline. Temporal wasting.  Cardiology: RRR, no murmurs/rubs/gallops. Resp: Normal respiratory rate and effort. CTAB, no wheezes, rhonchi, crackles.  Abd: G tube with mild irritation to surrounding area but no induration, fluctuance, purulence, or TTP. Soft, non-tender, non-distended. No rebound tenderness or guarding.  GU: Deferred. MSK: No peripheral edema or signs of trauma.  Skin: warm, dry.  Neuro: A&Ox4, CNs II-XII grossly intact. MAEs. Sensation grossly intact.   ED Results / Procedures / Treatments   Labs (all labs ordered are listed, but only abnormal results are displayed) Labs Reviewed  CBC WITH DIFFERENTIAL/PLATELET  COMPREHENSIVE METABOLIC PANEL  LACTIC ACID, PLASMA  LIPASE, BLOOD    EKG None  Radiology DG Chest 2 View  Result Date: 12/23/2022 CLINICAL DATA:  Community-acquired pneumonia EXAM: CHEST - 2 VIEW COMPARISON:  11/12/2020 FINDINGS: Lungs are hyperinflated in keeping with changes of underlying COPD. No pneumothorax or pleural effusion.  Interval right internal jugular chest port placement with its tip within the superior vena cava. Cardiac size within normal limits. Pulmonary vascularity is normal. No acute bone abnormality. IMPRESSION: 1. No radiographic evidence of acute cardiopulmonary disease. 2. COPD. Electronically Signed   By: Helyn Numbers M.D.   On: 12/23/2022 23:10   CT ABDOMEN PELVIS W CONTRAST  Result Date: 12/23/2022 CLINICAL DATA:  Leaking feeding tube and constipation EXAM: CT  ABDOMEN AND PELVIS WITH CONTRAST TECHNIQUE: Multidetector CT imaging of the abdomen and pelvis was performed using the standard protocol following bolus administration of intravenous contrast. RADIATION DOSE REDUCTION: This exam was performed according to the departmental dose-optimization program which includes automated exposure control, adjustment of the mA and/or kV according to patient size and/or use of iterative reconstruction technique. CONTRAST:  75mL OMNIPAQUE IOHEXOL 350 MG/ML SOLN COMPARISON:  PET CT 11/26/2022 FINDINGS: Lower chest: Lung bases demonstrate no consolidation or effusion. Minimal tree-in-bud density in the posterior right lower lobe. Hepatobiliary: No focal liver abnormality is seen. No gallstones, gallbladder wall thickening, or biliary dilatation. Pancreas: Unremarkable. No pancreatic ductal dilatation or surrounding inflammatory changes. Spleen: Normal in size without focal abnormality. Adrenals/Urinary Tract: Adrenal glands are within normal limits. Kidneys show no hydronephrosis. The bladder is unremarkable Stomach/Bowel: Gastrostomy tube in the stomach. No dilated small bowel. No acute bowel wall thickening. Moderate stool burden with moderate feces retention at the rectum Vascular/Lymphatic: Mild aortic atherosclerosis. No aneurysm. No suspicious lymph nodes Reproductive: Negative for mass Other: Cachectic appearing patient with paucity of subcutaneous and intraabdominal fat which limits the exam. Negative for pelvic effusion or free air Musculoskeletal: No fracture or malalignment. Mild remodeling of the posterior iliac bones without frank destructive change or evidence for active infection IMPRESSION: 1. Gastrostomy tube in the stomach. 2. Moderate stool burden with moderate feces retention at the rectum. 3. Cachectic appearing patient with paucity of subcutaneous and intraabdominal fat which limits the exam. 4. Minimal tree-in-bud density in the posterior right lower lobe,  question minimal bronchiolitis or aspiration. 5. Aortic atherosclerosis. Aortic Atherosclerosis (ICD10-I70.0).  22 Electronically Signed   By: Jasmine Pang M.D.   On: 12/23/2022 20:37    Procedures Gastrostomy tube replacement  Date/Time: 12/25/2022 4:30 PM  Performed by: Loetta Rough, MD Authorized by: Loetta Rough, MD  Consent: Verbal consent obtained. Risks and benefits: risks, benefits and alternatives were discussed Consent given by: patient Patient understanding: patient states understanding of the procedure being performed Imaging studies: imaging studies available Patient identity confirmed: verbally with patient Time out: Immediately prior to procedure a "time out" was called to verify the correct patient, procedure, equipment, support staff and site/side marked as required. Local anesthesia used: no  Anesthesia: Local anesthesia used: no  Sedation: Patient sedated: no  Patient tolerance: patient tolerated the procedure well with no immediate complications       Medications Ordered in ED Medications  lactated ringers bolus 1,000 mL (has no administration in time range)    ED Course/ Medical Decision Making/ A&P                          Medical Decision Making Amount and/or Complexity of Data Reviewed Labs: ordered. Decision-making details documented in ED Course. Radiology: ordered. Decision-making details documented in ED Course.  Risk Prescription drug management. Decision regarding hospitalization.    This patient presents to the ED for concern of malfunctioning g tube, constipation, this involves an extensive number of treatment options, and is  a complaint that carries with it a high risk of complications and morbidity.  I considered the following differential and admission for this acute, potentially life threatening condition. HDS, afebrile.  MDM:    Consider hypovolemia likely cause of hypotension in s/o malfunctioning/leaking G tube and  patient reporting he feels very dehydrated, dry mucous membranes. Indeed, blood pressure recovered w/ IVF. He has minimal abdominal pain, no N/V, but will obtain CT scan to r/o SBO or dislodged/malpositioned G tube. Likely he is constipated from increased pain medication after presentation for cancer pain a few days ago as well as dehydration. Will obtain labs to eval for electrolyte derangements, renal injury.   Clinical Course as of 12/25/22 1623  Thu Dec 23, 2022  1718 BP 102 systolic [HN]  1815 Lactic Acid, Venous(!!): 3.3 Giving fluids [HN]  1830 WBC: 8.6 No leukocytosis  [HN]  1831 Hemoglobin(!): 11.9 Decrease from 12.9 two months ago. No rectal bleeding reported. [HN]  2056 Lipase: 19 wnl [HN]  2056 CT ABDOMEN PELVIS W CONTRAST 1. Gastrostomy tube in the stomach. 2. Moderate stool burden with moderate feces retention at the rectum. 3. Cachectic appearing patient with paucity of subcutaneous and intraabdominal fat which limits the exam. 4. Minimal tree-in-bud density in the posterior right lower lobe, question minimal bronchiolitis or aspiration. 5. Aortic atherosclerosis.   [HN]  2113 CO2(!): 37 [HN]  2113 Chloride(!): 87 Pt with contraction alkalosis likely from G tube leaking as patient stated. Unclear why exactly leaking so much if it's in the right place as noted on CT. He presented hypotensive w/ elevated lactate d/t dehydration. Will need IV hydration and reevaluation of labs in the morning, monitoring and ensuring G tube functioning appropriately.  [HN]    Clinical Course User Index [HN] Loetta Rough, MD    Labs: I Ordered, and personally interpreted labs.  The pertinent results include:  those lsited above  Imaging Studies ordered: I ordered imaging studies including CT abd pelvis I independently visualized and interpreted imaging. I agree with the radiologist interpretation KUB ordered after G tube replacement, pending  Additional history obtained from  chart reivew.  External records from outside source obtained and reviewed including [facility]  Reevaluation: After the interventions noted above, I reevaluated the patient and found that they have :improved  Social Determinants of Health: Lives independently  Disposition:  Admitted to hosptialist  Co morbidities that complicate the patient evaluation History reviewed. No pertinent past medical history.   Medicines Meds ordered this encounter  Medications   lactated ringers bolus 1,000 mL    I have reviewed the patients home medicines and have made adjustments as needed  Problem List / ED Course: Problem List Items Addressed This Visit       Other   * (Principal) Dehydration    Will rehydrate      Other Visit Diagnoses     Leaking percutaneous endoscopic gastrostomy (PEG) tube [facility])    -  Primary                   This note was created using dictation software, which may contain spelling or grammatical errors.    Loetta Rough, MD 12/25/22 [id number]

## 2022-12-23 NOTE — H&P (Signed)
Jeff [name] VWU:[id number] DOB: September 04, 1960 DOA: 12/23/2022   PCP: Pcp, No   Outpatient Specialists:  ENT  Patient arrived to ER on 12/23/22 at 1507 Referred by Attending Loetta Rough, MD   Patient coming from:    home Lives alone,    Chief Complaint:  Chief Complaint  Patient presents with   feeding tube dislodged    Feeding tube dislodged    HPI: Jeff [name] is a 62 y.o. male with medical history significant of tongue cancer sp G tube, malnutrition    Presented with  leaking around g tube Patient comes in from home with complaints of leaking from a feeding tube and constipation Seems like the feeding tube got dislodged History of head and neck cancer supposed to start chemo next week He sees ENT for tongue mass  Endorses constipation   Denies significant ETOH intake   Does not smoke , quit  Lab Results  Component Value Date   SARSCOV2NAA POSITIVE (A) 11/12/2020    Regarding pertinent Chronic problems:      COPD - not  followed by pulmonology    Chronic anemia - baseline hg Hemoglobin & Hematocrit  Recent Labs    09/26/22 1843 12/23/22 1740  HGB 12.9* 11.9*    Hx of HEad and neck cancer followed by ENT  While in ER: Clinical Course as of 12/23/22 2151  Thu Dec 23, 2022  1718 BP 102 systolic [HN]  1815 Lactic Acid, Venous(!!): 3.3 Giving fluids [HN]  1830 WBC: 8.6 No leukocytosis  [HN]  1831 Hemoglobin(!): 11.9 Decrease from 12.9 two months ago. No rectal bleeding reported. [HN]  2056 Lipase: 19 wnl [HN]  2056 CT ABDOMEN PELVIS W CONTRAST 1. Gastrostomy tube in the stomach. 2. Moderate stool burden with moderate feces retention at the rectum. 3. Cachectic appearing patient with paucity of subcutaneous and intraabdominal fat which limits the exam. 4. Minimal tree-in-bud density in the posterior right lower lobe, question minimal bronchiolitis or aspiration. 5. Aortic atherosclerosis.   [HN]  2113 CO2(!): 37 [HN]  2113 Chloride(!):  87 Pt with contraction alkalosis likely from G tube leaking as patient stated. Unclear why exactly leaking so much if it's in the right place as noted on CT. He presented hypotensive w/ elevated lactate d/t dehydration. Will need IV hydration and reevaluation of labs in the morning, monitoring and ensuring G tube functioning appropriately.  [HN]    Clinical Course User Index [HN] Loetta Rough, MD       Lab Orders         CBC with Differential         Comprehensive metabolic panel         Lipase, blood         Lipase, blood         I-Stat CG4 Lactic Acid       CXR - COPD  CTabd/pelvis - Gastrostomy tube in the stomach. 2. Moderate stool burden with moderate feces retention at the rectum. 3. Cachectic appearing patient with paucity of subcutaneous and intraabdominal fat which limits the exam. 4. Minimal tree-in-bud density in the posterior right lower lobe, question minimal bronchiolitis or aspiration.    Following Medications were ordered in ER: Medications  lactated ringers bolus 1,000 mL (1,000 mLs Intravenous New Bag/Given 12/23/22 1739)  iohexol (OMNIPAQUE) 350 MG/ML injection 75 mL (75 mLs Intravenous Contrast Given 12/23/22 1909)       ED Triage Vitals  Encounter Vitals Group  BP 12/23/22 1515 (!) 88/65     Systolic BP Percentile --      Diastolic BP Percentile --      Pulse Rate 12/23/22 1515 93     Resp 12/23/22 1515 16     Temp 12/23/22 1515 98 F (36.7 C)     Temp Source 12/23/22 1515 Oral     SpO2 12/23/22 1515 100 %     Weight 12/23/22 1517 87 lb (39.5 kg)     Height 12/23/22 1517 5\' 9"  (1.753 m)     Head Circumference --      Peak Flow --      Pain Score --      Pain Loc --      Pain Education --      Exclude from Growth Chart --   WUJW(11)@     _________________________________________ Significant initial  Findings: Abnormal Labs Reviewed  CBC WITH DIFFERENTIAL/PLATELET - Abnormal; Notable for the following components:      Result Value    RBC 3.96 (*)    Hemoglobin 11.9 (*)    HCT 37.2 (*)    All other components within normal limits  COMPREHENSIVE METABOLIC PANEL - Abnormal; Notable for the following components:   Chloride 87 (*)    CO2 37 (*)    Glucose, Bld 112 (*)    BUN 24 (*)    Calcium 10.6 (*)    Total Protein 9.0 (*)    All other components within normal limits  I-STAT CG4 LACTIC ACID, ED - Abnormal; Notable for the following components:   Lactic Acid, Venous 3.3 (*)    All other components within normal limits    ECG: Ordered   The recent clinical data is shown below. Vitals:   12/23/22 1515 12/23/22 1517 12/23/22 1754  BP: (!) 88/65  104/76  Pulse: 93  80  Resp: 16  17  Temp: 98 F (36.7 C)  97.6 F (36.4 C)  TempSrc: Oral    SpO2: 100%  97%  Weight:  39.5 kg   Height:  5\' 9"  (1.753 m)     WBC     Component Value Date/Time   WBC 8.6 12/23/2022 1740   LYMPHSABS 2.3 12/23/2022 1740   MONOABS 0.9 12/23/2022 1740   EOSABS 0.0 12/23/2022 1740   BASOSABS 0.1 12/23/2022 1740    Lactic Acid, Venous    Component Value Date/Time   LATICACIDVEN 3.3 (HH) 12/23/2022 1749    Procalcitonin   Ordered      UA   ordered    VBG pending   _____________________________________________ Recent Labs  Lab 12/23/22 1740  NA 139  K 3.8  CO2 37*  GLUCOSE 112*  BUN 24*  CREATININE 0.94  CALCIUM 10.6*    Cr   stable,   Lab Results  Component Value Date   CREATININE 0.94 12/23/2022   CREATININE 0.86 09/26/2022   CREATININE 1.03 11/12/2020    Recent Labs  Lab 12/23/22 1740  AST 22  ALT 10  ALKPHOS 78  BILITOT 0.6  PROT 9.0*  ALBUMIN 4.0   Lab Results  Component Value Date   CALCIUM 10.6 (H) 12/23/2022    Plt: Lab Results  Component Value Date   PLT 299 12/23/2022       Recent Labs  Lab 12/23/22 1740  WBC 8.6  NEUTROABS 5.3  HGB 11.9*  HCT 37.2*  MCV 93.9  PLT 299    HG/HCT stable,       Component Value  Date/Time   HGB 11.9 (L) 12/23/2022 1740   HCT 37.2 (L)  12/23/2022 1740   MCV 93.9 12/23/2022 1740     Recent Labs  Lab 12/23/22 1740  LIPASE 19    __________________________________ Hospitalist was called for admission for   Leaking percutaneous endoscopic gastrostomy (PEG) tube  Dehydration    The following Work up has been ordered so far:  Orders Placed This Encounter  Procedures   CT ABDOMEN PELVIS W CONTRAST   CBC with Differential   Comprehensive metabolic panel   Lipase, blood   Lipase, blood   Consult for Unassigned Medical Admission   I-Stat CG4 Lactic Acid   Insert peripheral IV     OTHER Significant initial  Findings:  labs showing:     DM  labs:  HbA1C: No results for input(s): "HGBA1C" in the last 8760 hours.     CBG (last 3)  No results for input(s): "GLUCAP" in the last 72 hours.        Cultures: No results found for: "SDES", "SPECREQUEST", "CULT", "REPTSTATUS"   Radiological Exams on Admission: CT ABDOMEN PELVIS W CONTRAST  Result Date: 12/23/2022 CLINICAL DATA:  Leaking feeding tube and constipation EXAM: CT ABDOMEN AND PELVIS WITH CONTRAST TECHNIQUE: Multidetector CT imaging of the abdomen and pelvis was performed using the standard protocol following bolus administration of intravenous contrast. RADIATION DOSE REDUCTION: This exam was performed according to the departmental dose-optimization program which includes automated exposure control, adjustment of the mA and/or kV according to patient size and/or use of iterative reconstruction technique. CONTRAST:  75mL OMNIPAQUE IOHEXOL 350 MG/ML SOLN COMPARISON:  PET CT 11/26/2022 FINDINGS: Lower chest: Lung bases demonstrate no consolidation or effusion. Minimal tree-in-bud density in the posterior right lower lobe. Hepatobiliary: No focal liver abnormality is seen. No gallstones, gallbladder wall thickening, or biliary dilatation. Pancreas: Unremarkable. No pancreatic ductal dilatation or surrounding inflammatory changes. Spleen: Normal in size without focal  abnormality. Adrenals/Urinary Tract: Adrenal glands are within normal limits. Kidneys show no hydronephrosis. The bladder is unremarkable Stomach/Bowel: Gastrostomy tube in the stomach. No dilated small bowel. No acute bowel wall thickening. Moderate stool burden with moderate feces retention at the rectum Vascular/Lymphatic: Mild aortic atherosclerosis. No aneurysm. No suspicious lymph nodes Reproductive: Negative for mass Other: Cachectic appearing patient with paucity of subcutaneous and intraabdominal fat which limits the exam. Negative for pelvic effusion or free air Musculoskeletal: No fracture or malalignment. Mild remodeling of the posterior iliac bones without frank destructive change or evidence for active infection IMPRESSION: 1. Gastrostomy tube in the stomach. 2. Moderate stool burden with moderate feces retention at the rectum. 3. Cachectic appearing patient with paucity of subcutaneous and intraabdominal fat which limits the exam. 4. Minimal tree-in-bud density in the posterior right lower lobe, question minimal bronchiolitis or aspiration. 5. Aortic atherosclerosis. Aortic Atherosclerosis (ICD10-I70.0).  22 Electronically Signed   By: Jasmine Pang M.D.   On: 12/23/2022 20:37   _______________________________________________________________________________________________________ Latest  Blood pressure 104/76, pulse 80, temperature 97.6 F (36.4 C), resp. rate 17, height 5\' 9"  (1.753 m), weight 39.5 kg, SpO2 97%.   Vitals  labs and radiology finding personally reviewed  Review of Systems:    Pertinent positives include:  abdominal pain, constipation  Constitutional:  No weight loss, night sweats, Fevers, chills, fatigue, weight loss  HEENT:  No headaches, Difficulty swallowing,Tooth/dental problems,Sore throat,  No sneezing, itching, ear ache, nasal congestion, post nasal drip,  Cardio-vascular:  No chest pain, Orthopnea, PND, anasarca, dizziness, palpitations.no Bilateral lower  extremity  swelling  GI:  No heartburn, indigestion, nausea, vomiting, diarrhea, change in bowel habits, loss of appetite, melena, blood in stool, hematemesis Resp:  no shortness of breath at rest. No dyspnea on exertion, No excess mucus, no productive cough, No non-productive cough, No coughing up of blood.No change in color of mucus.No wheezing. Skin:  no rash or lesions. No jaundice GU:  no dysuria, change in color of urine, no urgency or frequency. No straining to urinate.  No flank pain.  Musculoskeletal:  No joint pain or no joint swelling. No decreased range of motion. No back pain.  Psych:  No change in mood or affect. No depression or anxiety. No memory loss.  Neuro: no localizing neurological complaints, no tingling, no weakness, no double vision, no gait abnormality, no slurred speech, no confusion  All systems reviewed and apart from HOPI all are negative _______________________________________________________________________________________________ Past Medical History:  History reviewed. No pertinent past medical history.    Past Surgical History:  Procedure Laterality Date   NO PAST SURGERIES     ORIF ANKLE FRACTURE Left 01/24/2015   Procedure: OPEN REDUCTION INTERNAL FIXATION (ORIF) LEFT ANKLE FRACTURE;  Surgeon: Nadara Mustard, MD;  Location: [facility] OR;  Service: Orthopedics;  Laterality: Left;    Social History:  Ambulatory   independently      reports that he has been smoking cigarettes. He has a 30 pack-year smoking history. He has never used smokeless tobacco. He reports current alcohol use of about 2.0 standard drinks of alcohol per week. He reports current drug use. Drug: Marijuana.     Family History:   History reviewed. No pertinent family history. ______________________________________________________________________________________________ Allergies: No Known Allergies   Prior to Admission medications   Medication Sig Start Date End Date Taking?  Authorizing Provider  amoxicillin-clavulanate (AUGMENTIN) 875-125 MG tablet Take 1 tablet by mouth every 12 (twelve) hours. 09/03/22   Carlisle Beers, FNP  Aspirin-Salicylamide-Caffeine (BC HEADACHE POWDER PO) Take 1 each by mouth as needed (for headache). Patient not taking: Reported on 09/03/2022    [provider]  cetirizine (ZYRTEC) 10 MG tablet Take 1 tablet (10 mg total) by mouth daily. Patient not taking: Reported on 09/03/2022 06/08/17   Pisciotta, Joni Reining, PA-C  chlorhexidine (PERIDEX) 0.12 % solution Use as directed 15 mLs in the mouth or throat 2 (two) times daily. 10/03/22   Doreene Eland, MD  ibuprofen (ADVIL) 200 MG tablet Take 200 mg by mouth every 6 (six) hours as needed.    [provider]  naloxone [facility]) nasal spray 4 mg/0.1 mL Place 1 spray into the nose as needed (respiratory depression after oxycodone use). 12/19/22   [provider]  oxyCODONE-acetaminophen (PERCOCET) 10-325 MG tablet Take 1 tablet by mouth every 6 (six) hours as needed for pain. 12/19/22   [provider]  triamcinolone ointment (KENALOG) 0.1 % Apply 1 Application topically 2 (two) times daily. 11/30/22 02/28/23  [provider]    ___________________________________________________________________________________________________ Physical Exam:    12/23/2022    5:54 PM 12/23/2022    3:17 PM 12/23/2022    3:15 PM  Vitals with BMI  Height  5\' 9"    Weight  87 lbs   BMI  12.84   Systolic 104  88  Diastolic 76  65  Pulse 80  93     1. General:  in No  Acute distress   Chronically ill  cachectic -appearing 2. Psychological: Alert and   Oriented 3. Head/ENT:    Dry Mucous Membranes  Head Non traumatic, neck supple                           Poor Dentition 4. SKIN:  decreased Skin turgor,  Skin clean Dry and intact no rash    5. Heart: Regular rate and rhythm no  Murmur, no Rub or gallop 6. Lungs:   no wheezes or crackles    7. Abdomen: Soft,  non-tender, Non distended  bowel sounds present G tube in place 8. Lower extremities: no clubbing, cyanosis, no  edema 9. Neurologically Grossly intact, moving all 4 extremities equally   10. MSK: Normal range of motion    Chart has been reviewed  ______________________________________________________________________________________________  Assessment/Plan  62 y.o. 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  DVT prophylaxis:  SCD      Code Status:    Code Status: Not on file FULL CODE  as per patient   I had personally discussed CODE STATUS with patient   ACP   none   Family Communication:   Family not at  Bedside    Diet NPO   Disposition Plan:   To home once workup is complete and patient is stable   Following barriers for discharge:                                                       Electrolytes corrected                               Anemia   stable                             Pain controlled with PO medications                                                                                  Would benefit from PT/OT eval prior to DC  Ordered  Transition of care consulted                   Nutrition    consulted                                       Consults called: none   Admission status:  ED Disposition     ED Disposition  Admit   Condition  --   Comment  [facility]ospital [facility] [[id number]]  Level of Care: Progressive [102]  Admit to Progressive based on following criteria: MULTISYSTEM THREATS such as stable sepsis, metabolic/electrolyte imbalance with or without encephalopathy that is responding to early treatment.  May place patient in observation at [facility] or [facility] if equivalent level of care is available:: No  Covid Evaluation: Asymptomatic - no recent exposure (last 10 days) testing not required  Diagnosis: Dehydration [276.51.ICD-9-CM]  Admitting Physician: Therisa Doyne [[id number]]  Attending Physician: Therisa Doyne [[id number]]           Obs      Level of care       progressive     stepdown   tele indefinitely please discontinue once patient no longer qualifies COVID-19 Labs   Jeff [name] 12/24/2022, 12:07 AM    [facility]     after 2 AM please page floor coverage PA If 7AM-7PM, please contact the day team taking care of the patient using [email or web address]

## 2022-12-23 NOTE — Subjective & Objective (Signed)
Patient comes in from home with complaints of leaking from a feeding tube and constipation Seems like the feeding tube got dislodged History of head and neck cancer supposed to start chemo next week He sees ENT for tongue mass

## 2022-12-23 NOTE — ED Triage Notes (Signed)
PT BIB [facility] from home, Aox4, complains of leaking feeding tube and constipation. [facility] vitals 98BP palpated, 80pulse, 120 CBG, 16 RR, spO2 100% room air.

## 2022-12-24 ENCOUNTER — Observation Stay (HOSPITAL_COMMUNITY): Payer: No Typology Code available for payment source

## 2022-12-24 DIAGNOSIS — C01 Malignant neoplasm of base of tongue: Secondary | ICD-10-CM | POA: Diagnosis present

## 2022-12-24 DIAGNOSIS — E860 Dehydration: Secondary | ICD-10-CM | POA: Diagnosis not present

## 2022-12-24 DIAGNOSIS — Z681 Body mass index (BMI) 19 or less, adult: Secondary | ICD-10-CM | POA: Diagnosis not present

## 2022-12-24 DIAGNOSIS — E873 Alkalosis: Secondary | ICD-10-CM | POA: Diagnosis present

## 2022-12-24 DIAGNOSIS — K9413 Enterostomy malfunction: Secondary | ICD-10-CM | POA: Diagnosis present

## 2022-12-24 DIAGNOSIS — R64 Cachexia: Secondary | ICD-10-CM | POA: Diagnosis not present

## 2022-12-24 DIAGNOSIS — E874 Mixed disorder of acid-base balance: Secondary | ICD-10-CM | POA: Diagnosis present

## 2022-12-24 DIAGNOSIS — E872 Acidosis, unspecified: Secondary | ICD-10-CM | POA: Diagnosis present

## 2022-12-24 DIAGNOSIS — D630 Anemia in neoplastic disease: Secondary | ICD-10-CM | POA: Diagnosis present

## 2022-12-24 DIAGNOSIS — K5900 Constipation, unspecified: Secondary | ICD-10-CM | POA: Diagnosis present

## 2022-12-24 DIAGNOSIS — K9423 Gastrostomy malfunction: Secondary | ICD-10-CM | POA: Diagnosis present

## 2022-12-24 DIAGNOSIS — Z79899 Other long term (current) drug therapy: Secondary | ICD-10-CM | POA: Diagnosis not present

## 2022-12-24 DIAGNOSIS — F17210 Nicotine dependence, cigarettes, uncomplicated: Secondary | ICD-10-CM | POA: Diagnosis present

## 2022-12-24 DIAGNOSIS — I959 Hypotension, unspecified: Secondary | ICD-10-CM | POA: Diagnosis present

## 2022-12-24 DIAGNOSIS — R1310 Dysphagia, unspecified: Secondary | ICD-10-CM | POA: Diagnosis not present

## 2022-12-24 DIAGNOSIS — E43 Unspecified severe protein-calorie malnutrition: Secondary | ICD-10-CM | POA: Diagnosis not present

## 2022-12-24 DIAGNOSIS — G893 Neoplasm related pain (acute) (chronic): Secondary | ICD-10-CM | POA: Diagnosis present

## 2022-12-24 LAB — URINALYSIS, COMPLETE (UACMP) WITH MICROSCOPIC
Bacteria, UA: NONE SEEN
Bilirubin Urine: NEGATIVE
Glucose, UA: NEGATIVE mg/dL
Hgb urine dipstick: NEGATIVE
Ketones, ur: 5 mg/dL — AB
Leukocytes,Ua: NEGATIVE
Nitrite: NEGATIVE
Protein, ur: NEGATIVE mg/dL
Specific Gravity, Urine: >1.046 — ABNORMAL HIGH (ref 1.005–1.030)
pH: 8 (ref 5.0–8.0)

## 2022-12-24 LAB — CBC
HCT: 34.4 % — ABNORMAL LOW (ref 39.0–52.0)
Hemoglobin: 11.3 g/dL — ABNORMAL LOW (ref 13.0–17.0)
MCH: 31.4 pg (ref 26.0–34.0)
MCHC: 32.8 g/dL (ref 30.0–36.0)
MCV: 95.6 fL (ref 80.0–100.0)
Platelets: 261 10*3/uL (ref 150–400)
RBC: 3.6 MIL/uL — ABNORMAL LOW (ref 4.22–5.81)
RDW: 12.8 % (ref 11.5–15.5)
WBC: 6.5 10*3/uL (ref 4.0–10.5)
nRBC: 0 % (ref 0.0–0.2)

## 2022-12-24 LAB — IRON AND TIBC
Iron: 48 ug/dL (ref 45–182)
Saturation Ratios: 14 % — ABNORMAL LOW (ref 17.9–39.5)
TIBC: 351 ug/dL (ref 250–450)
UIBC: 303 ug/dL

## 2022-12-24 LAB — MAGNESIUM
Magnesium: 2.1 mg/dL (ref 1.7–2.4)
Magnesium: 2.1 mg/dL (ref 1.7–2.4)

## 2022-12-24 LAB — HIV ANTIBODY (ROUTINE TESTING W REFLEX): HIV Screen 4th Generation wRfx: NONREACTIVE

## 2022-12-24 LAB — PREALBUMIN: Prealbumin: 16 mg/dL — ABNORMAL LOW (ref 18–38)

## 2022-12-24 LAB — RETICULOCYTES
Immature Retic Fract: 8.6 % (ref 2.3–15.9)
RBC.: 3.47 MIL/uL — ABNORMAL LOW (ref 4.22–5.81)
Retic Count, Absolute: 29.1 10*3/uL (ref 19.0–186.0)
Retic Ct Pct: 0.8 % (ref 0.4–3.1)

## 2022-12-24 LAB — GLUCOSE, CAPILLARY
Glucose-Capillary: 81 mg/dL (ref 70–99)
Glucose-Capillary: 92 mg/dL (ref 70–99)

## 2022-12-24 LAB — SODIUM, URINE, RANDOM: Sodium, Ur: 96 mmol/L

## 2022-12-24 LAB — LIPASE, BLOOD: Lipase: 17 U/L (ref 11–51)

## 2022-12-24 LAB — OSMOLALITY, URINE: Osmolality, Ur: 802 mosm/kg (ref 300–900)

## 2022-12-24 LAB — OSMOLALITY: Osmolality: 298 mosm/kg — ABNORMAL HIGH (ref 275–295)

## 2022-12-24 LAB — TSH: TSH: 1.05 u[IU]/mL (ref 0.350–4.500)

## 2022-12-24 LAB — CK: Total CK: 44 U/L — ABNORMAL LOW (ref 49–397)

## 2022-12-24 LAB — CBG MONITORING, ED
Glucose-Capillary: 67 mg/dL — ABNORMAL LOW (ref 70–99)
Glucose-Capillary: 62 mg/dL — ABNORMAL LOW (ref 70–99)

## 2022-12-24 LAB — PHOSPHORUS
Phosphorus: 3.9 mg/dL (ref 2.5–4.6)
Phosphorus: 4.1 mg/dL (ref 2.5–4.6)

## 2022-12-24 LAB — FOLATE: Folate: 19 ng/mL (ref >5.9)

## 2022-12-24 LAB — PROTIME-INR
INR: 1 (ref 0.8–1.2)
Prothrombin Time: 13.8 s (ref 11.4–15.2)

## 2022-12-24 LAB — CREATININE, URINE, RANDOM: Creatinine, Urine: 101 mg/dL

## 2022-12-24 LAB — LACTIC ACID, PLASMA: Lactic Acid, Venous: 1.5 mmol/L (ref 0.5–1.9)

## 2022-12-24 LAB — FERRITIN: Ferritin: 264 ng/mL (ref 24–336)

## 2022-12-24 LAB — PROCALCITONIN: Procalcitonin: 0.32 ng/mL

## 2022-12-24 LAB — VITAMIN B12: Vitamin B-12: 792 pg/mL (ref 180–914)

## 2022-12-24 MED ORDER — OSMOLITE 1.2 CAL PO LIQD
1000.0000 mL | ORAL | Status: DC
  Administered 2022-12-24: 1000 mL
  Filled 2022-12-24 (×2): qty 1000

## 2022-12-24 MED ORDER — MORPHINE SULFATE (PF) 4 MG/ML IV SOLN
4.0000 mg | Freq: Once | INTRAVENOUS | Status: AC
  Administered 2022-12-24: 4 mg via INTRAVENOUS
  Filled 2022-12-24: qty 1

## 2022-12-24 MED ORDER — OXYCODONE HCL 5 MG PO TABS: 5.0000 mg | ORAL_TABLET | Freq: Four times a day (QID) | ORAL | Status: DC | PRN

## 2022-12-24 MED ORDER — ONDANSETRON HCL 4 MG/2ML IJ SOLN: 4.0000 mg | Freq: Four times a day (QID) | INTRAMUSCULAR | Status: DC | PRN

## 2022-12-24 MED ORDER — MILK AND MOLASSES ENEMA
1.0000 | Freq: Once | RECTAL | Status: DC
  Filled 2022-12-24: qty 240

## 2022-12-24 MED ORDER — ONDANSETRON HCL 4 MG PO TABS: 4.0000 mg | ORAL_TABLET | Freq: Four times a day (QID) | ORAL | Status: DC | PRN

## 2022-12-24 MED ORDER — OXYCODONE-ACETAMINOPHEN 5-325 MG PO TABS: 1.0000 | ORAL_TABLET | Freq: Four times a day (QID) | ORAL | Status: DC | PRN

## 2022-12-24 MED ORDER — OXYCODONE-ACETAMINOPHEN 10-325 MG PO TABS: 1.0000 | ORAL_TABLET | Freq: Four times a day (QID) | ORAL | Status: DC | PRN

## 2022-12-24 MED ORDER — THIAMINE HCL 100 MG/ML IJ SOLN
100.0000 mg | Freq: Every day | INTRAMUSCULAR | Status: DC
  Administered 2022-12-24 – 2022-12-25 (×2): 100 mg via INTRAVENOUS
  Filled 2022-12-24 (×2): qty 2

## 2022-12-24 MED ORDER — BISACODYL 10 MG RE SUPP: 10.0000 mg | Freq: Every day | RECTAL | Status: DC | PRN

## 2022-12-24 MED ORDER — SORBITOL 70 % SOLN: 960.0000 mL | TOPICAL_OIL | Freq: Once | ORAL | Status: DC | PRN

## 2022-12-24 MED ORDER — ACETAMINOPHEN 650 MG RE SUPP: 650.0000 mg | Freq: Four times a day (QID) | RECTAL | Status: DC | PRN

## 2022-12-24 MED ORDER — ONDANSETRON HCL 4 MG/2ML IJ SOLN
4.0000 mg | Freq: Once | INTRAMUSCULAR | Status: AC
  Administered 2022-12-24: 4 mg via INTRAVENOUS
  Filled 2022-12-24: qty 2

## 2022-12-24 MED ORDER — ACETAMINOPHEN 325 MG PO TABS: 650.0000 mg | ORAL_TABLET | Freq: Four times a day (QID) | ORAL | Status: DC | PRN

## 2022-12-24 MED ORDER — ALBUTEROL SULFATE (2.5 MG/3ML) 0.083% IN NEBU: 2.5000 mg | INHALATION_SOLUTION | RESPIRATORY_TRACT | Status: DC | PRN

## 2022-12-24 MED ORDER — POLYETHYLENE GLYCOL 3350 17 G PO PACK
17.0000 g | PACK | Freq: Every day | ORAL | Status: DC
  Administered 2022-12-24 – 2022-12-25 (×2): 17 g
  Filled 2022-12-24 (×2): qty 1

## 2022-12-24 MED ORDER — FENTANYL CITRATE PF 50 MCG/ML IJ SOSY: 12.5000 ug | PREFILLED_SYRINGE | INTRAMUSCULAR | Status: DC | PRN

## 2022-12-24 MED ORDER — MORPHINE SULFATE (PF) 2 MG/ML IV SOLN
2.0000 mg | INTRAVENOUS | Status: DC | PRN
  Administered 2022-12-24 – 2022-12-25 (×4): 2 mg via INTRAVENOUS
  Filled 2022-12-24 (×6): qty 1

## 2022-12-24 MED ORDER — SODIUM CHLORIDE 0.9 % IV SOLN: INTRAVENOUS | Status: AC

## 2022-12-24 NOTE — Assessment & Plan Note (Signed)
Will rehydrate 

## 2022-12-24 NOTE — Progress Notes (Signed)
62   y.o. male outpatient. History of tongue cancer s/p  20 Fr  surgical placed gastrostomy tube placed on 8.30.24 by [facility]. Patient admitted for feeding tube malfunction. Patient seen at bedside. G tube functional  currently running TPN through continuous infusion. Patient states that the gastrostomy tube was leaking yesterday although he states that it has improved today.  Clean dry dressing in place. No drainage or erythema noted at the exit site. The flange was found to be 3 inches away the patient's abdomen. The flange was repositioned to be taunt but comfortable against the Patient's stomach. Education regarding the gastrostomy tube was provided. Patient verbalized understanding.

## 2022-12-24 NOTE — Assessment & Plan Note (Signed)
In the setting of dehydration  Will rehydrate and recheck

## 2022-12-24 NOTE — Assessment & Plan Note (Signed)
In the setting of dehydration and G tube leak ER provider to attempt to place a larger G tube If no improvement may need consult in AM (it is unclear who put the tube in originaly)

## 2022-12-24 NOTE — Progress Notes (Addendum)
PROGRESS NOTE    Jeff [name]  ZOX:[id number] DOB: April 25, 1960 DOA: 12/23/2022 PCP: Pcp, No    Brief Narrative:  Jeff [name] is a 62 y.o. male with medical history significant of tongue cancer sp G tube, malnutrition Patient comes in from home with complaints of leaking from a feeding tube and constipation.  Seems like the feeding tube got dislodged. Replaced in ER.  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     Severe protein-calorie malnutrition (HCC) Nutrition consult   Constipation -bowel regimen-- daily and PRN including suppositories   Lactic acidosis -hydrate   DVT prophylaxis:     Code Status: Full Code Family Communication:   Disposition Plan:  Level of care: Progressive Status is: Observation The patient will require care spanning > 2 midnights and should be moved to inpatient    Consultants:  none   Subjective: Not sure what happened to his PEG tube  Objective: Vitals:   12/24/22 0400 12/24/22 0541 12/24/22 0921 12/24/22 0930  BP: 91/72 91/72 (!) 149/119 95/75  Pulse:  73    Resp:  18 16   Temp:      TempSrc:      SpO2:  100% 97%   Weight:      Height:        Intake/Output Summary (Last 24 hours) at 12/24/2022 1002 Last data filed at 12/24/2022 8119 Gross per 24 hour  Intake 1000 ml  Output 100 ml  Net 900 ml   Filed Weights   12/23/22 1517  Weight: 39.5 kg    Examination:   General: Appearance:    Cachectic male in no acute distress     Lungs:    respirations unlabored  Heart:    Normal heart rate.    MS:   All extremities  are intact.    Neurologic:   Awake, alert, oriented x 3. No apparent focal neurological           defect.        Data Reviewed: I have personally reviewed following labs and imaging studies  CBC: Recent Labs  Lab 12/23/22 1740  WBC 8.6  NEUTROABS 5.3  HGB 11.9*  HCT 37.2*  MCV 93.9  PLT 299   Basic Metabolic Panel: Recent Labs  Lab 12/23/22 1740 12/24/22 0525  NA 139  --   K 3.8  --   CL 87*  --   CO2 37*  --   GLUCOSE 112*  --   BUN 24*  --   CREATININE 0.94  --   CALCIUM 10.6*  --   MG  --  2.1  PHOS  --  3.9   GFR: Estimated Creatinine Clearance: 46.1 mL/min (by C-G formula based on SCr of 0.94 mg/dL). Liver Function Tests: Recent Labs  Lab 12/23/22 1740  AST 22  ALT 10  ALKPHOS 78  BILITOT 0.6  PROT 9.0*  ALBUMIN 4.0   Recent Labs  Lab 12/23/22 1740 12/24/22 0525  LIPASE 19 17   No results for input(s): "AMMONIA" in the last 168 hours. Coagulation  Profile: Recent Labs  Lab 12/24/22 0525  INR 1.0   Cardiac Enzymes: Recent Labs  Lab 12/24/22 0525  CKTOTAL 44*   BNP (last 3 results) No results for input(s): "PROBNP" in the last 8760 hours. HbA1C: No results for input(s): "HGBA1C" in the last 72 hours. CBG: Recent Labs  Lab 12/24/22 0815  GLUCAP 67*   Lipid Profile: No results for input(s): "CHOL", "HDL", "LDLCALC", "TRIG", "CHOLHDL", "LDLDIRECT" in the last 72 hours. Thyroid Function Tests: Recent Labs    12/24/22 0525  TSH 1.050   Anemia Panel: Recent Labs    12/24/22 0523 12/24/22 0525  VITAMINB12 792  --   FOLATE  --  19.0  FERRITIN  --  264  TIBC  --  351  IRON  --  48  RETICCTPCT  --  0.8   Sepsis Labs: Recent Labs  Lab 12/23/22 1749 12/23/22 2255 12/24/22 0525  PROCALCITON  --   --  0.32  LATICACIDVEN 3.3* 1.5  --     No results found for this or any previous visit (from the past 240 hour(s)).       Radiology Studies: DG Abd Portable 1 View  Result Date: 12/24/2022 CLINICAL DATA:  G-tube  placement.  Feeding tube dislodged. EXAM: PORTABLE ABDOMEN - 1 VIEW COMPARISON:  CT abdomen and pelvis 12/23/2022 FINDINGS: Percutaneous gastrostomy tube in the left abdomen. Contrast within the renal collecting systems limits evaluation for extrarenal contrast. The bowel gas pattern is normal. No radio-opaque calculi or other significant radiographic abnormality are seen. IMPRESSION: Percutaneous gastrostomy tube in the left abdomen. Contrast within the renal collecting systems limits evaluation for extrarenal contrast. Electronically Signed   By: Minerva Fester M.D.   On: 12/24/2022 02:49   DG Chest 2 View  Result Date: 12/23/2022 CLINICAL DATA:  Community-acquired pneumonia EXAM: CHEST - 2 VIEW COMPARISON:  11/12/2020 FINDINGS: Lungs are hyperinflated in keeping with changes of underlying COPD. No pneumothorax or pleural effusion. Interval right internal jugular chest port placement with its tip within the superior vena cava. Cardiac size within normal limits. Pulmonary vascularity is normal. No acute bone abnormality. IMPRESSION: 1. No radiographic evidence of acute cardiopulmonary disease. 2. COPD. Electronically Signed   By: Helyn Numbers M.D.   On: 12/23/2022 23:10   CT ABDOMEN PELVIS W CONTRAST  Result Date: 12/23/2022 CLINICAL DATA:  Leaking feeding tube and constipation EXAM: CT ABDOMEN AND PELVIS WITH CONTRAST TECHNIQUE: Multidetector CT imaging of the abdomen and pelvis was performed using the standard protocol following bolus administration of intravenous contrast. RADIATION DOSE REDUCTION: This exam was performed according to the departmental dose-optimization program which includes automated exposure control, adjustment of the mA and/or kV according to patient size and/or use of iterative reconstruction technique. CONTRAST:  75mL OMNIPAQUE IOHEXOL 350 MG/ML SOLN COMPARISON:  PET CT 11/26/2022 FINDINGS: Lower chest: Lung bases demonstrate no consolidation or effusion. Minimal tree-in-bud  density in the posterior right lower lobe. Hepatobiliary: No focal liver abnormality is seen. No gallstones, gallbladder wall thickening, or biliary dilatation. Pancreas: Unremarkable. No pancreatic ductal dilatation or surrounding inflammatory changes. Spleen: Normal in size without focal abnormality. Adrenals/Urinary Tract: Adrenal glands are within normal limits. Kidneys show no hydronephrosis. The bladder is unremarkable Stomach/Bowel: Gastrostomy tube in the stomach. No dilated small bowel. No acute bowel wall thickening. Moderate stool burden with moderate feces retention at the rectum Vascular/Lymphatic: Mild aortic atherosclerosis. No aneurysm. No suspicious lymph nodes Reproductive: Negative for mass Other: Cachectic appearing patient with paucity of subcutaneous and intraabdominal fat  which limits the exam. Negative for pelvic effusion or free air Musculoskeletal: No fracture or malalignment. Mild remodeling of the posterior iliac bones without frank destructive change or evidence for active infection IMPRESSION: 1. Gastrostomy tube in the stomach. 2. Moderate stool burden with moderate feces retention at the rectum. 3. Cachectic appearing patient with paucity of subcutaneous and intraabdominal fat which limits the exam. 4. Minimal tree-in-bud density in the posterior right lower lobe, question minimal bronchiolitis or aspiration. 5. Aortic atherosclerosis. Aortic Atherosclerosis (ICD10-I70.0).  22 Electronically Signed   By: Jasmine Pang M.D.   On: 12/23/2022 20:37        Scheduled Meds:  bisacodyl  10 mg Rectal Once   polyethylene glycol  17 g Per Tube Daily   Continuous Infusions:  feeding supplement (OSMOLITE 1.2 CAL)       LOS: 0 days    Time spent: 45 minutes spent on chart review, discussion with nursing staff, consultants, updating family and interview/physical exam; more than 50% of that time was spent in counseling and/or coordination of care.    Joseph Art, DO [facility] Available via Epic secure chat 7am-7pm After these hours, please refer to coverage provider listed on [email or web address] 12/24/2022, 10:02 AM

## 2022-12-24 NOTE — Assessment & Plan Note (Signed)
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

## 2022-12-24 NOTE — Assessment & Plan Note (Signed)
Order bowel regimen °

## 2022-12-24 NOTE — Assessment & Plan Note (Signed)
Sp G tube

## 2022-12-24 NOTE — ED Notes (Signed)
At 0430 Rn assessed pt peg tube. It was clean and dry. Pt skin was intact surrounding tube. Rn placed split gauze. RN requested order for tube feeding.  When order came RN called multiple people for supplies. RN requested the pump for the tube feed. After requesting pump 3 times the pump was delivered. When RN entered room to begin tube feeding pt reports leakage around tube and there was yellow/green drainage on gauze.

## 2022-12-24 NOTE — ED Notes (Signed)
ED TO INPATIENT HANDOFF REPORT  ED Nurse Name and Phone #: Joneen Boers, Paramedic / [phone]  S Name/Age/Gender Jeff [name] 62 y.o. male Room/Bed: 011C/011C  Code Status   Code Status: Full Code  Home/SNF/Other Home Patient oriented to: self, place, time, and situation Is this baseline? Yes   Triage Complete: Triage complete  Chief Complaint Dehydration [E86.0] Leak of percutaneous jejunostomy tube [facility]) [K94.13]  Triage Note PT BIB [facility] from home, [facility], complains of leaking feeding tube and constipation. [facility] vitals 98BP palpated, 80pulse, 120 CBG, 16 RR, spO2 100% room air.    Allergies No Known Allergies  Level of Care/Admitting Diagnosis ED Disposition     ED Disposition  Admit   Condition  --   Comment  Hospital Area: [facility] [[id number]]  Level of Care: Med-Surg [16]  May admit patient to [facility] or [facility] if equivalent level of care is available:: No  Covid Evaluation: Asymptomatic - no recent exposure (last 10 days) testing not required  Diagnosis: Leak of percutaneous jejunostomy tube [facility]) [[id number]]  Admitting Physician: Florene Glen  Attending Physician: Joseph Art [phone]  Certification:: I certify this patient will need inpatient services for at least 2 midnights          B Medical/Surgery History History reviewed. No pertinent past medical history. Past Surgical History:  Procedure Laterality Date   NO PAST SURGERIES     ORIF ANKLE FRACTURE Left 01/24/2015   Procedure: OPEN REDUCTION INTERNAL FIXATION (ORIF) LEFT ANKLE FRACTURE;  Surgeon: Nadara Mustard, MD;  Location: [facility] OR;  Service: Orthopedics;  Laterality: Left;     A IV Location/Drains/Wounds Patient Lines/Drains/Airways Status     Active Line/Drains/Airways     Name Placement date Placement time Site Days   Peripheral IV 12/23/22 20 G Anterior;Left;Proximal Forearm 12/23/22  1739  Forearm  1            Intake/Output Last  24 hours  Intake/Output Summary (Last 24 hours) at 12/24/2022 1127 Last data filed at 12/24/2022 4403 Gross per 24 hour  Intake 1000 ml  Output 100 ml  Net 900 ml    Labs/Imaging Results for orders placed or performed during the hospital encounter of 12/23/22 (from the past 48 hour(s))  CBC with Differential     Status: Abnormal   Collection Time: 12/23/22  5:40 PM  Result Value Ref Range   WBC 8.6 4.0 - 10.5 K/uL   RBC 3.96 (L) 4.22 - 5.81 MIL/uL   Hemoglobin 11.9 (L) 13.0 - 17.0 g/dL   HCT 47.4 (L) 25.9 - 56.3 %   MCV 93.9 80.0 - 100.0 fL   MCH 30.1 26.0 - 34.0 pg   MCHC 32.0 30.0 - 36.0 g/dL   RDW 87.5 64.3 - 32.9 %   Platelets 299 150 - 400 K/uL   nRBC 0.0 0.0 - 0.2 %   Neutrophils Relative % 62 %   Neutro Abs 5.3 1.7 - 7.7 K/uL   Lymphocytes Relative 27 %   Lymphs Abs 2.3 0.7 - 4.0 K/uL   Monocytes Relative 10 %   Monocytes Absolute 0.9 0.1 - 1.0 K/uL   Eosinophils Relative 0 %   Eosinophils Absolute 0.0 0.0 - 0.5 K/uL   Basophils Relative 1 %   Basophils Absolute 0.1 0.0 - 0.1 K/uL   Immature Granulocytes 0 %   Abs Immature Granulocytes 0.01 0.00 - 0.07 K/uL    Comment: Performed at [facility], [address] [place], [place] [address]  Comprehensive metabolic panel     Status: Abnormal   Collection Time: 12/23/22  5:40 PM  Result Value Ref Range   Sodium 139 135 - 145 mmol/L   Potassium 3.8 3.5 - 5.1 mmol/L   Chloride 87 (L) 98 - 111 mmol/L   CO2 37 (H) 22 - 32 mmol/L   Glucose, Bld 112 (H) 70 - 99 mg/dL    Comment: Glucose reference range applies only to samples taken after fasting for at least 8 hours.   BUN 24 (H) 8 - 23 mg/dL   Creatinine, Ser 8.41 0.61 - 1.24 mg/dL   Calcium 66.0 (H) 8.9 - 10.3 mg/dL   Total Protein 9.0 (H) 6.5 - 8.1 g/dL   Albumin 4.0 3.5 - 5.0 g/dL   AST 22 15 - 41 U/L   ALT 10 0 - 44 U/L   Alkaline Phosphatase 78 38 - 126 U/L   Total Bilirubin 0.6 0.3 - 1.2 mg/dL   GFR, Estimated >63 >01 mL/min    Comment:  (NOTE) Calculated using the CKD-EPI Creatinine Equation (2021)    Anion gap 15 5 - 15    Comment: Performed at [facility], [address] [place], [place] [address]  Lipase, blood     Status: None   Collection Time: 12/23/22  5:40 PM  Result Value Ref Range   Lipase 19 11 - 51 U/L    Comment: Performed at [facility], [address], [place], [place] [address]  I-Stat CG4 Lactic Acid     Status: Abnormal   Collection Time: 12/23/22  5:49 PM  Result Value Ref Range   Lactic Acid, Venous 3.3 (HH) 0.5 - 1.9 mmol/L   Comment NOTIFIED PHYSICIAN   Osmolality, urine     Status: None   Collection Time: 12/23/22 10:05 PM  Result Value Ref Range   Osmolality, Ur 802 300 - 900 mOsm/kg    Comment: Performed at [facility], [address], [place], [place] [address]  Creatinine, urine, random     Status: None   Collection Time: 12/23/22 10:05 PM  Result Value Ref Range   Creatinine, Urine 101 mg/dL    Comment: Performed at [facility], [address] [place], [place] [address]  Urinalysis, Complete w Microscopic -Urine, Clean Catch     Status: Abnormal   Collection Time: 12/23/22 10:05 PM  Result Value Ref Range   Color, Urine YELLOW YELLOW   APPearance HAZY (A) CLEAR   Specific Gravity, Urine >1.046 (H) 1.005 - 1.030   pH 8.0 5.0 - 8.0   Glucose, UA NEGATIVE NEGATIVE mg/dL   Hgb urine dipstick NEGATIVE NEGATIVE   Bilirubin Urine NEGATIVE NEGATIVE   Ketones, ur 5 (A) NEGATIVE mg/dL   Protein, ur NEGATIVE NEGATIVE mg/dL   Nitrite NEGATIVE NEGATIVE   Leukocytes,Ua NEGATIVE NEGATIVE   RBC / HPF 0-5 0 - 5 RBC/hpf   WBC, UA 0-5 0 - 5 WBC/hpf   Bacteria, UA NONE SEEN NONE SEEN   Squamous Epithelial / HPF 0-5 0 - 5 /HPF   Amorphous Crystal PRESENT     Comment: Performed at [facility], [address], [place], [place] [address]  Sodium, urine, random     Status: None   Collection Time: 12/23/22 10:05 PM  Result Value Ref Range   Sodium, Ur 96 mmol/L     Comment: Performed at [facility], [address], [place], [place] [address]  Lactic acid, plasma     Status: None   Collection Time: 12/23/22 10:55 PM  Result Value Ref Range   Lactic Acid, Venous 1.5 0.5 - 1.9 mmol/L    Comment: Performed at [facility], [address], [place], [place] [address]  Vitamin B12     Status: None   Collection Time: 12/24/22  5:23 AM  Result Value Ref Range   Vitamin B-12 792 180 - 914 pg/mL    Comment: (NOTE) This assay is not validated for testing neonatal or myeloproliferative syndrome specimens for Vitamin B12 levels. Performed at [facility], [address], [place], [place] [address]   Lipase, blood     Status: None   Collection Time: 12/24/22  5:25 AM  Result Value Ref Range   Lipase 17 11 - 51 U/L    Comment: Performed at [facility], [address] [place], [place] [address]  CK     Status: Abnormal   Collection Time: 12/24/22  5:25 AM  Result Value Ref Range   Total CK 44 (L) 49 - 397 U/L    Comment: Performed at [facility], [address] [place], [place] [address]  Phosphorus     Status: None   Collection Time: 12/24/22  5:25 AM  Result Value Ref Range   Phosphorus 3.9 2.5 - 4.6 mg/dL    Comment: Performed at [facility], [address], [place], [place] [address]  Magnesium     Status: None   Collection Time: 12/24/22  5:25 AM  Result Value Ref Range   Magnesium 2.1 1.7 - 2.4 mg/dL    Comment: Performed at [facility], [address], [place], [place] [address]  Osmolality     Status: Abnormal   Collection Time: 12/24/22  5:25 AM  Result Value Ref Range   Osmolality 298 (H) 275 - 295 mOsm/kg    Comment: Performed at [facility], [address], [place], [place] [address]  TSH     Status: None   Collection Time: 12/24/22  5:25 AM  Result Value Ref Range   TSH 1.050 0.350 - 4.500 uIU/mL    Comment: Performed by a 3rd Generation assay with a functional sensitivity of  <=0.01 uIU/mL. Performed at [facility], [address], [place], [place] [address]   Prealbumin     Status: Abnormal   Collection Time: 12/24/22  5:25 AM  Result Value Ref Range   Prealbumin 16 (L) 18 - 38 mg/dL    Comment: Performed at [facility], [address] [place], [place] [address]  Protime-INR     Status: None   Collection Time: 12/24/22  5:25 AM  Result Value Ref Range   Prothrombin Time 13.8 11.4 - 15.2 seconds   INR 1.0 0.8 - 1.2    Comment: (NOTE) INR goal varies based on device and disease states. Performed at [facility], [address], [place], [place] [address]   Folate     Status: None   Collection Time: 12/24/22  5:25 AM  Result Value Ref Range   Folate 19.0 >5.9 ng/mL    Comment: Performed at [facility], [address] [place], [place] [address]  Iron and TIBC     Status: Abnormal   Collection Time: 12/24/22  5:25 AM  Result Value Ref Range   Iron 48 45 - 182  ug/dL   TIBC 914 782 - 956 ug/dL   Saturation Ratios 14 (L) 17.9 - 39.5 %   UIBC 303 ug/dL    Comment: Performed at [facility], [address] [place], [place] [address]  Ferritin     Status: None   Collection Time: 12/24/22  5:25 AM  Result Value Ref Range   Ferritin 264 24 - 336 ng/mL    Comment: Performed at [facility], [address], [place], [place] [address]  Reticulocytes     Status: Abnormal   Collection Time: 12/24/22  5:25 AM  Result Value Ref Range   Retic Ct Pct 0.8 0.4 - 3.1 %   RBC. 3.47 (L) 4.22 - 5.81 MIL/uL   Retic Count, Absolute 29.1 19.0 - 186.0 K/uL   Immature Retic Fract 8.6 2.3 - 15.9 %    Comment: Performed at [facility], [address] [place], [place] [address]  Procalcitonin     Status: None   Collection Time: 12/24/22  5:25 AM  Result Value Ref Range   Procalcitonin 0.32 ng/mL    Comment:        Interpretation: PCT (Procalcitonin) <= 0.5 ng/mL: Systemic infection (sepsis) is not likely. Local bacterial  infection is possible. (NOTE)       Sepsis PCT Algorithm           Lower Respiratory Tract                                      Infection PCT Algorithm    ----------------------------     ----------------------------         PCT < 0.25 ng/mL                PCT < 0.10 ng/mL          Strongly encourage             Strongly discourage   discontinuation of antibiotics    initiation of antibiotics    ----------------------------     -----------------------------       PCT 0.25 - 0.50 ng/mL            PCT 0.10 - 0.25 ng/mL               OR       >80% decrease in PCT            Discourage initiation of                                            antibiotics      Encourage discontinuation           of antibiotics    ----------------------------     -----------------------------         PCT >= 0.50 ng/mL              PCT 0.26 - 0.50 ng/mL               AND        <80% decrease in PCT             Encourage initiation of  antibiotics       Encourage continuation           of antibiotics    ----------------------------     -----------------------------        PCT >= 0.50 ng/mL                  PCT > 0.50 ng/mL               AND         increase in PCT                  Strongly encourage                                      initiation of antibiotics    Strongly encourage escalation           of antibiotics                                     -----------------------------                                           PCT <= 0.25 ng/mL                                                 OR                                        > 80% decrease in PCT                                      Discontinue / Do not initiate                                             antibiotics  Performed at [facility], [address], [place], [place] [address]   CBG monitoring, ED     Status: Abnormal   Collection Time: 12/24/22  8:15 AM  Result Value Ref Range    Glucose-Capillary 67 (L) 70 - 99 mg/dL    Comment: Glucose reference range applies only to samples taken after fasting for at least 8 hours.   DG Abd Portable 1 View  Result Date: 12/24/2022 CLINICAL DATA:  G-tube placement.  Feeding tube dislodged. EXAM: PORTABLE ABDOMEN - 1 VIEW COMPARISON:  CT abdomen and pelvis 12/23/2022 FINDINGS: Percutaneous gastrostomy tube in the left abdomen. Contrast within the renal collecting systems limits evaluation for extrarenal contrast. The bowel gas pattern is normal. No radio-opaque calculi or other significant radiographic abnormality are seen. IMPRESSION: Percutaneous gastrostomy tube in the left abdomen. Contrast within the renal collecting systems limits evaluation for extrarenal contrast. Electronically Signed   By: Minerva Fester M.D.   On: 12/24/2022 02:49   DG Chest  2 View  Result Date: 12/23/2022 CLINICAL DATA:  Community-acquired pneumonia EXAM: CHEST - 2 VIEW COMPARISON:  11/12/2020 FINDINGS: Lungs are hyperinflated in keeping with changes of underlying COPD. No pneumothorax or pleural effusion. Interval right internal jugular chest port placement with its tip within the superior vena cava. Cardiac size within normal limits. Pulmonary vascularity is normal. No acute bone abnormality. IMPRESSION: 1. No radiographic evidence of acute cardiopulmonary disease. 2. COPD. Electronically Signed   By: Helyn Numbers M.D.   On: 12/23/2022 23:10   CT ABDOMEN PELVIS W CONTRAST  Result Date: 12/23/2022 CLINICAL DATA:  Leaking feeding tube and constipation EXAM: CT ABDOMEN AND PELVIS WITH CONTRAST TECHNIQUE: Multidetector CT imaging of the abdomen and pelvis was performed using the standard protocol following bolus administration of intravenous contrast. RADIATION DOSE REDUCTION: This exam was performed according to the departmental dose-optimization program which includes automated exposure control, adjustment of the mA and/or kV according to patient size and/or  use of iterative reconstruction technique. CONTRAST:  75mL OMNIPAQUE IOHEXOL 350 MG/ML SOLN COMPARISON:  PET CT 11/26/2022 FINDINGS: Lower chest: Lung bases demonstrate no consolidation or effusion. Minimal tree-in-bud density in the posterior right lower lobe. Hepatobiliary: No focal liver abnormality is seen. No gallstones, gallbladder wall thickening, or biliary dilatation. Pancreas: Unremarkable. No pancreatic ductal dilatation or surrounding inflammatory changes. Spleen: Normal in size without focal abnormality. Adrenals/Urinary Tract: Adrenal glands are within normal limits. Kidneys show no hydronephrosis. The bladder is unremarkable Stomach/Bowel: Gastrostomy tube in the stomach. No dilated small bowel. No acute bowel wall thickening. Moderate stool burden with moderate feces retention at the rectum Vascular/Lymphatic: Mild aortic atherosclerosis. No aneurysm. No suspicious lymph nodes Reproductive: Negative for mass Other: Cachectic appearing patient with paucity of subcutaneous and intraabdominal fat which limits the exam. Negative for pelvic effusion or free air Musculoskeletal: No fracture or malalignment. Mild remodeling of the posterior iliac bones without frank destructive change or evidence for active infection IMPRESSION: 1. Gastrostomy tube in the stomach. 2. Moderate stool burden with moderate feces retention at the rectum. 3. Cachectic appearing patient with paucity of subcutaneous and intraabdominal fat which limits the exam. 4. Minimal tree-in-bud density in the posterior right lower lobe, question minimal bronchiolitis or aspiration. 5. Aortic atherosclerosis. Aortic Atherosclerosis (ICD10-I70.0).  22 Electronically Signed   By: Jasmine Pang M.D.   On: 12/23/2022 20:37    Pending Labs Unresulted Labs (From admission, onward)     Start     Ordered   12/25/22 0500  CBC  Tomorrow morning,   R        12/24/22 1005   12/25/22 0500  Basic metabolic panel  Tomorrow morning,   R         12/24/22 1005   12/23/22 2205  Vitamin B1  Add-on,   AD        12/23/22 2204   Signed and Held  HIV Antibody (routine testing w rflx)  (HIV Antibody (Routine testing w reflex) panel)  Once,   R        Signed and Held   Signed and Held  Magnesium  Tomorrow morning,   R        Signed and Held   Signed and Held  Phosphorus  Tomorrow morning,   R        Signed and Held   Signed and Held  Comprehensive metabolic panel  Tomorrow morning,   R       Question:  Release to patient  Answer:  Immediate  Signed and Held   Signed and Held  CBC  Tomorrow morning,   R       Question:  Release to patient  Answer:  Immediate   Signed and Held            Vitals/Pain Today's Vitals   12/24/22 0908 12/24/22 0921 12/24/22 0930 12/24/22 1029  BP:  (!) 149/119 95/75   Pulse:      Resp:  16    Temp:      TempSrc:      SpO2:  97%    Weight:      Height:      PainSc: 10-Worst pain ever   6     Isolation Precautions No active isolations  Medications Medications  bisacodyl (DULCOLAX) suppository 10 mg (10 mg Rectal Not Given 12/24/22 0922)  feeding supplement (OSMOLITE 1.2 CAL) liquid 1,000 mL (1,000 mLs Per Tube New Bag/Given 12/24/22 1108)  morphine (PF) 2 MG/ML injection 2 mg (2 mg Intravenous Given by Other 12/24/22 0937)  bisacodyl (DULCOLAX) suppository 10 mg (has no administration in time range)  sorbitol, milk of mag, mineral oil, glycerin (SMOG) enema (has no administration in time range)  polyethylene glycol (MIRALAX / GLYCOLAX) packet 17 g (17 g Per Tube Given 12/24/22 1048)  lactated ringers bolus 1,000 mL (0 mLs Intravenous Stopped 12/24/22 0102)  iohexol (OMNIPAQUE) 350 MG/ML injection 75 mL (75 mLs Intravenous Contrast Given 12/23/22 1909)  morphine (PF) 4 MG/ML injection 4 mg (4 mg Intravenous Given 12/24/22 0133)  ondansetron (ZOFRAN) injection 4 mg (4 mg Intravenous Given 12/24/22 0133)    Mobility walks with person assist     Focused Assessments Cardiac Assessment  Handoff:    Lab Results  Component Value Date   CKTOTAL 44 (L) 12/24/2022   No results found for: "DDIMER" Does the Patient currently have chest pain? No    R Recommendations: See Admitting Provider Note  Report given to:   Additional Notes: PT self suctions, PT is expressing concern about radiation treatment appointment.

## 2022-12-24 NOTE — ED Notes (Signed)
This Clinical research associate spoke with Optician, dispensing to inform same that PT was on the way to the unit.

## 2022-12-24 NOTE — Assessment & Plan Note (Signed)
Followed by ENT 

## 2022-12-24 NOTE — Assessment & Plan Note (Signed)
Nutrition consult in AM

## 2022-12-24 NOTE — ED Notes (Addendum)
Started pt. On tube feedings. 20 ml/hr.

## 2022-12-25 DIAGNOSIS — E860 Dehydration: Secondary | ICD-10-CM | POA: Diagnosis not present

## 2022-12-25 LAB — CBC
HCT: 30.9 % — ABNORMAL LOW (ref 39.0–52.0)
HCT: 29.7 % — ABNORMAL LOW (ref 39.0–52.0)
Hemoglobin: 9.8 g/dL — ABNORMAL LOW (ref 13.0–17.0)
Hemoglobin: 9.7 g/dL — ABNORMAL LOW (ref 13.0–17.0)
MCH: 29.6 pg (ref 26.0–34.0)
MCH: 30.4 pg (ref 26.0–34.0)
MCHC: 31.7 g/dL (ref 30.0–36.0)
MCHC: 32.7 g/dL (ref 30.0–36.0)
MCV: 93.4 fL (ref 80.0–100.0)
MCV: 93.1 fL (ref 80.0–100.0)
Platelets: 245 10*3/uL (ref 150–400)
Platelets: 252 10*3/uL (ref 150–400)
RBC: 3.31 MIL/uL — ABNORMAL LOW (ref 4.22–5.81)
RBC: 3.19 MIL/uL — ABNORMAL LOW (ref 4.22–5.81)
RDW: 12.7 % (ref 11.5–15.5)
RDW: 12.6 % (ref 11.5–15.5)
WBC: 7.5 10*3/uL (ref 4.0–10.5)
WBC: 6.8 10*3/uL (ref 4.0–10.5)
nRBC: 0 % (ref 0.0–0.2)
nRBC: 0 % (ref 0.0–0.2)

## 2022-12-25 LAB — GLUCOSE, CAPILLARY
Glucose-Capillary: 116 mg/dL — ABNORMAL HIGH (ref 70–99)
Glucose-Capillary: 123 mg/dL — ABNORMAL HIGH (ref 70–99)
Glucose-Capillary: 94 mg/dL (ref 70–99)

## 2022-12-25 LAB — BASIC METABOLIC PANEL
Anion gap: 11 (ref 5–15)
BUN: 20 mg/dL (ref 8–23)
CO2: 32 mmol/L (ref 22–32)
Calcium: 9.6 mg/dL (ref 8.9–10.3)
Chloride: 94 mmol/L — ABNORMAL LOW (ref 98–111)
Creatinine, Ser: 0.77 mg/dL (ref 0.61–1.24)
GFR, Estimated: >60 mL/min (ref >60)
Glucose, Bld: 115 mg/dL — ABNORMAL HIGH (ref 70–99)
Potassium: 3.6 mmol/L (ref 3.5–5.1)
Sodium: 137 mmol/L (ref 135–145)

## 2022-12-25 MED ORDER — HYDRALAZINE HCL 20 MG/ML IJ SOLN: 10.0000 mg | INTRAMUSCULAR | Status: DC | PRN

## 2022-12-25 MED ORDER — METOPROLOL TARTRATE 5 MG/5ML IV SOLN: 5.0000 mg | INTRAVENOUS | Status: DC | PRN

## 2022-12-25 MED ORDER — IPRATROPIUM-ALBUTEROL 0.5-2.5 (3) MG/3ML IN SOLN: 3.0000 mL | RESPIRATORY_TRACT | Status: DC | PRN

## 2022-12-25 MED ORDER — SENNOSIDES-DOCUSATE SODIUM 8.6-50 MG PO TABS: 1.0000 | ORAL_TABLET | Freq: Every evening | ORAL | Status: DC | PRN

## 2022-12-25 MED ORDER — GUAIFENESIN 100 MG/5ML PO LIQD: 5.0000 mL | ORAL | Status: DC | PRN

## 2022-12-25 NOTE — Hospital Course (Addendum)
  Brief Narrative:  Jeff [name] is a 62 y.o. male with medical history significant of tongue cancer sp G tube, malnutrition Patient comes in from home with complaints of leaking from a feeding tube and constipation.  Seems like the feeding tube got dislodged. Replaced in ER.  Seen by IR now tolerating tube feeds.   Assessment and Plan: Dehydration secondary to G-tube malfunction ER replaced the tube.  Seen by IR.  Seems to be functioning well.  CT confirmed placement   Moderate to severe malnutrition Squamous cell carcinoma of the tongue Dysphagia In the setting of HEad and NEck Cancer followed by ENT Currently G-tube in place Nutrition consult   Constipation Bowel regimen   Lactic acidosis Resolved     DVT prophylaxis: SCDs Start: 12/24/22 1314 Code Status: Full Code Family Communication:  DC

## 2022-12-25 NOTE — Discharge Summary (Signed)
Physician Discharge Summary  Jeff [name] ZOX:[id number] DOB: 1960-05-06 DOA: 12/23/2022  PCP: Pcp, No  Admit date: 12/23/2022 Discharge date: 12/25/2022  Admitted From: Home Disposition: Home Home  Recommendations for Outpatient Follow-up:  Follow up with PCP in 1-2 weeks Please obtain BMP/CBC in one week your next doctors visit.    Discharge Condition: Stable CODE STATUS: Full code Diet recommendation: Tube feeds    Brief Narrative:  Jeff [name] is a 62 y.o. male with medical history significant of tongue cancer sp G tube, malnutrition Patient comes in from home with complaints of leaking from a feeding tube and constipation.  Seems like the feeding tube got dislodged. Replaced in ER.  Seen by IR now tolerating tube feeds.   Assessment and Plan: Dehydration secondary to G-tube malfunction ER replaced the tube.  Seen by IR.  Seems to be functioning well.  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  Patient is demanding that he wants to go home as he is tolerating his tube feeds without any issues.  Discharge Exam: Vitals:   12/25/22 0509 12/25/22 0727  BP: 90/73 100/68  Pulse: 68 66  Resp: 18 18  Temp: 97.7 F (36.5 C) 98 F (36.7 C)  SpO2: 99%  100%   Vitals:   12/24/22 2038 12/25/22 0500 12/25/22 0509 12/25/22 0727  BP: (!) 85/58  90/73 100/68  Pulse: 63  68 66  Resp: 18  18 18   Temp: 98 F (36.7 C)  97.7 F (36.5 C) 98 F (36.7 C)  TempSrc: Oral  Oral   SpO2: 98%  99% 100%  Weight:  37.5 kg    Height:        General: Pt is alert, awake, not in acute distress Cardiovascular: RRR, S1/S2 +, no rubs, no gallops Respiratory: CTA bilaterally, no wheezing, no rhonchi Abdominal: Soft, NT, ND, bowel sounds + Extremities: no edema, no cyanosis Tube feed/G-tube noted  Discharge Instructions   Allergies as of 12/25/2022   No Known Allergies      Medication List     TAKE these medications    naloxone 4 MG/0.1ML Liqd nasal spray kit Commonly known as: NARCAN Place 1 spray into the nose as needed (respiratory depression after oxycodone use).   oxyCODONE-acetaminophen 10-325 MG tablet Commonly known as: PERCOCET Place 1 tablet into feeding tube every 6 (six) hours as needed for pain.   triamcinolone ointment 0.1 % Commonly known as: KENALOG Apply 1 Application topically 2 (two) times daily.        No Known Allergies  You were cared for by a hospitalist during your hospital stay. If you have any questions about your discharge medications or the care you received while you were in the hospital after you are discharged, you can  call the unit and asked to speak with the hospitalist on call if the hospitalist that took care of you is not available. Once you are discharged, your primary care physician will handle any further medical issues. Please note that no refills for any discharge medications will be authorized once you are discharged, as it is imperative that you return to your primary care physician (or establish a relationship with a primary care physician if you do not have one) for your aftercare needs so that they can reassess your need for medications and monitor your lab values.  You were cared for by a  hospitalist during your hospital stay. If you have any questions about your discharge medications or the care you received while you were in the hospital after you are discharged, you can call the unit and asked to speak with the hospitalist on call if the hospitalist that took care of you is not available. Once you are discharged, your primary care physician will handle any further medical issues. Please note that NO REFILLS for any discharge medications will be authorized once you are discharged, as it is imperative that you return to your primary care physician (or establish a relationship with a primary care physician if you do not have one) for your aftercare needs so that they can reassess your need for medications and monitor your lab values.  Please request your Prim.MD to go over all Hospital Tests and Procedure/Radiological results at the follow up, please get all Hospital records sent to your Prim MD by signing hospital release before you go home.  Get CBC, CMP, 2 view Chest X ray checked  by Primary MD during your next visit or SNF MD in 5-7 days ( we routinely change or add medications that can affect your baseline labs and fluid status, therefore we recommend that you get the mentioned basic workup next visit with your PCP, your PCP may decide not to get them or add new tests based on their clinical decision)  On your next visit with your primary care physician please Get Medicines reviewed and adjusted.  If you experience worsening of your admission symptoms, develop shortness of breath, life threatening emergency, suicidal or homicidal thoughts you must seek medical attention immediately by calling 911 or calling your MD immediately  if symptoms less severe.  You Must read complete instructions/literature along with all the possible adverse reactions/side effects for all the Medicines you take and that have been prescribed to you. Take any new Medicines after you have completely understood  and accpet all the possible adverse reactions/side effects.   Do not drive, operate heavy machinery, perform activities at heights, swimming or participation in water activities or provide baby sitting services if your were admitted for syncope or siezures until you have seen by Primary MD or a Neurologist and advised to do so again.  Do not drive when taking Pain medications.   Procedures/Studies: DG Abd Portable 1 View  Result Date: 12/24/2022 CLINICAL DATA:  G-tube placement.  Feeding tube dislodged. EXAM: PORTABLE ABDOMEN - 1 VIEW COMPARISON:  CT abdomen and pelvis 12/23/2022 FINDINGS: Percutaneous gastrostomy tube in the left abdomen. Contrast within the renal collecting systems limits evaluation for extrarenal contrast. The bowel gas pattern is normal. No radio-opaque calculi or other significant radiographic abnormality are seen. IMPRESSION: Percutaneous gastrostomy tube in the left abdomen. Contrast within the renal collecting systems limits evaluation for extrarenal contrast. Electronically Signed   By: Minerva Fester M.D.   On: 12/24/2022 02:49  DG Chest 2 View  Result Date: 12/23/2022 CLINICAL DATA:  Community-acquired pneumonia EXAM: CHEST - 2 VIEW COMPARISON:  11/12/2020 FINDINGS: Lungs are hyperinflated in keeping with changes of underlying COPD. No pneumothorax or pleural effusion. Interval right internal jugular chest port placement with its tip within the superior vena cava. Cardiac size within normal limits. Pulmonary vascularity is normal. No acute bone abnormality. IMPRESSION: 1. No radiographic evidence of acute cardiopulmonary disease. 2. COPD. Electronically Signed   By: Helyn Numbers M.D.   On: 12/23/2022 23:10   CT ABDOMEN PELVIS W CONTRAST  Result Date: 12/23/2022 CLINICAL DATA:  Leaking feeding tube and constipation EXAM: CT ABDOMEN AND PELVIS WITH CONTRAST TECHNIQUE: Multidetector CT imaging of the abdomen and pelvis was performed using the standard protocol  following bolus administration of intravenous contrast. RADIATION DOSE REDUCTION: This exam was performed according to the departmental dose-optimization program which includes automated exposure control, adjustment of the mA and/or kV according to patient size and/or use of iterative reconstruction technique. CONTRAST:  75mL OMNIPAQUE IOHEXOL 350 MG/ML SOLN COMPARISON:  PET CT 11/26/2022 FINDINGS: Lower chest: Lung bases demonstrate no consolidation or effusion. Minimal tree-in-bud density in the posterior right lower lobe. Hepatobiliary: No focal liver abnormality is seen. No gallstones, gallbladder wall thickening, or biliary dilatation. Pancreas: Unremarkable. No pancreatic ductal dilatation or surrounding inflammatory changes. Spleen: Normal in size without focal abnormality. Adrenals/Urinary Tract: Adrenal glands are within normal limits. Kidneys show no hydronephrosis. The bladder is unremarkable Stomach/Bowel: Gastrostomy tube in the stomach. No dilated small bowel. No acute bowel wall thickening. Moderate stool burden with moderate feces retention at the rectum Vascular/Lymphatic: Mild aortic atherosclerosis. No aneurysm. No suspicious lymph nodes Reproductive: Negative for mass Other: Cachectic appearing patient with paucity of subcutaneous and intraabdominal fat which limits the exam. Negative for pelvic effusion or free air Musculoskeletal: No fracture or malalignment. Mild remodeling of the posterior iliac bones without frank destructive change or evidence for active infection IMPRESSION: 1. Gastrostomy tube in the stomach. 2. Moderate stool burden with moderate feces retention at the rectum. 3. Cachectic appearing patient with paucity of subcutaneous and intraabdominal fat which limits the exam. 4. Minimal tree-in-bud density in the posterior right lower lobe, question minimal bronchiolitis or aspiration. 5. Aortic atherosclerosis. Aortic Atherosclerosis (ICD10-I70.0).  22 Electronically Signed   By:  Jasmine Pang M.D.   On: 12/23/2022 20:37     The results of significant diagnostics from this hospitalization (including imaging, microbiology, ancillary and laboratory) are listed below for reference.     Microbiology: No results found for this or any previous visit (from the past 240 hour(s)).   Labs: BNP (last 3 results) No results for input(s): "BNP" in the last 8760 hours. Basic Metabolic Panel: Recent Labs  Lab 12/23/22 1740 12/24/22 0525 12/24/22 1405 12/25/22 0229  NA 139  --   --  137  K 3.8  --   --  3.6  CL 87*  --   --  94*  CO2 37*  --   --  32  GLUCOSE 112*  --   --  115*  BUN 24*  --   --  20  CREATININE 0.94  --   --  0.77  CALCIUM 10.6*  --   --  9.6  MG  --  2.1 2.1  --   PHOS  --  3.9 4.1  --    Liver Function Tests: Recent Labs  Lab 12/23/22 1740  AST 22  ALT 10  ALKPHOS 78  BILITOT 0.6  PROT 9.0*  ALBUMIN 4.0   Recent Labs  Lab 12/23/22 1740 12/24/22 0525  LIPASE 19 17   No results for input(s): "AMMONIA" in the last 168 hours. CBC: Recent Labs  Lab 12/23/22 1740 12/24/22 1405 12/25/22 0229 12/25/22 1007  WBC 8.6 6.5 7.5 6.8  NEUTROABS 5.3  --   --   --   HGB 11.9* 11.3* 9.8* 9.7*  HCT 37.2* 34.4* 30.9* 29.7*  MCV 93.9 95.6 93.4 93.1  PLT 299 261 245 252   Cardiac Enzymes: Recent Labs  Lab 12/24/22 0525  CKTOTAL 44*   BNP: Invalid input(s): "POCBNP" CBG: Recent Labs  Lab 12/24/22 1425 12/24/22 2039 12/25/22 0030 12/25/22 0509 12/25/22 0725  GLUCAP 81 92 94 123* 116*   D-Dimer No results for input(s): "DDIMER" in the last 72 hours. Hgb A1c No results for input(s): "HGBA1C" in the last 72 hours. Lipid Profile No results for input(s): "CHOL", "HDL", "LDLCALC", "TRIG", "CHOLHDL", "LDLDIRECT" in the last 72 hours. Thyroid function studies Recent Labs    12/24/22 0525  TSH 1.050   Anemia work up Recent Labs    12/24/22 0523 12/24/22 0525  VITAMINB12 792  --   FOLATE  --  19.0  FERRITIN  --  264  TIBC   --  351  IRON  --  48  RETICCTPCT  --  0.8   Urinalysis    Component Value Date/Time   COLORURINE YELLOW 12/23/2022 2205   APPEARANCEUR HAZY (A) 12/23/2022 2205   LABSPEC >1.046 (H) 12/23/2022 2205   PHURINE 8.0 12/23/2022 2205   GLUCOSEU NEGATIVE 12/23/2022 2205   HGBUR NEGATIVE 12/23/2022 2205   BILIRUBINUR NEGATIVE 12/23/2022 2205   KETONESUR 5 (A) 12/23/2022 2205   PROTEINUR NEGATIVE 12/23/2022 2205   UROBILINOGEN 0.2 04/23/2009 0826   NITRITE NEGATIVE 12/23/2022 2205   LEUKOCYTESUR NEGATIVE 12/23/2022 2205   Sepsis Labs Recent Labs  Lab 12/23/22 1740 12/24/22 1405 12/25/22 0229 12/25/22 1007  WBC 8.6 6.5 7.5 6.8   Microbiology No results found for this or any previous visit (from the past 240 hour(s)).   Time coordinating discharge:  I have spent 35 minutes face to face with the patient and on the ward discussing the patients care, assessment, plan and disposition with other care givers. >50% of the time was devoted counseling the patient about the risks and benefits of treatment/Discharge disposition and coordinating care.   SIGNED:   Miguel Rota, MD  [facility] 12/25/2022, 11:03 AM   If 7PM-7AM, please contact night-coverage

## 2022-12-27 LAB — VITAMIN B1: Vitamin B1 (Thiamine): 119.6 nmol/L (ref 66.5–200.0)

## 2022-12-28 DIAGNOSIS — Z7689 Persons encountering health services in other specified circumstances: Secondary | ICD-10-CM | POA: Diagnosis not present

## 2022-12-29 DIAGNOSIS — Z7689 Persons encountering health services in other specified circumstances: Secondary | ICD-10-CM | POA: Diagnosis not present

## 2022-12-30 DIAGNOSIS — Z7689 Persons encountering health services in other specified circumstances: Secondary | ICD-10-CM | POA: Diagnosis not present

## 2022-12-31 DIAGNOSIS — Z7689 Persons encountering health services in other specified circumstances: Secondary | ICD-10-CM | POA: Diagnosis not present

## 2023-01-03 DIAGNOSIS — Z7689 Persons encountering health services in other specified circumstances: Secondary | ICD-10-CM | POA: Diagnosis not present

## 2023-01-04 DIAGNOSIS — Z7689 Persons encountering health services in other specified circumstances: Secondary | ICD-10-CM | POA: Diagnosis not present

## 2023-01-05 DIAGNOSIS — Z7689 Persons encountering health services in other specified circumstances: Secondary | ICD-10-CM | POA: Diagnosis not present

## 2023-01-06 DIAGNOSIS — Z7689 Persons encountering health services in other specified circumstances: Secondary | ICD-10-CM | POA: Diagnosis not present

## 2023-01-07 DIAGNOSIS — Z7689 Persons encountering health services in other specified circumstances: Secondary | ICD-10-CM | POA: Diagnosis not present

## 2023-01-10 DIAGNOSIS — Z7689 Persons encountering health services in other specified circumstances: Secondary | ICD-10-CM | POA: Diagnosis not present

## 2023-01-11 DIAGNOSIS — Z7689 Persons encountering health services in other specified circumstances: Secondary | ICD-10-CM | POA: Diagnosis not present

## 2023-01-12 DIAGNOSIS — Z7689 Persons encountering health services in other specified circumstances: Secondary | ICD-10-CM | POA: Diagnosis not present

## 2023-01-13 DIAGNOSIS — Z79630 Long term (current) use of alkylating agent: Secondary | ICD-10-CM | POA: Diagnosis not present

## 2023-01-13 DIAGNOSIS — Z510 Encounter for antineoplastic radiation therapy: Secondary | ICD-10-CM | POA: Diagnosis not present

## 2023-01-13 DIAGNOSIS — C01 Malignant neoplasm of base of tongue: Secondary | ICD-10-CM | POA: Diagnosis not present

## 2023-01-13 DIAGNOSIS — Z7689 Persons encountering health services in other specified circumstances: Secondary | ICD-10-CM | POA: Diagnosis not present

## 2023-01-13 DIAGNOSIS — Z931 Gastrostomy status: Secondary | ICD-10-CM | POA: Diagnosis not present

## 2023-01-14 DIAGNOSIS — C01 Malignant neoplasm of base of tongue: Secondary | ICD-10-CM | POA: Diagnosis not present

## 2023-01-14 DIAGNOSIS — Z419 Encounter for procedure for purposes other than remedying health state, unspecified: Secondary | ICD-10-CM | POA: Diagnosis not present

## 2023-01-14 DIAGNOSIS — Z510 Encounter for antineoplastic radiation therapy: Secondary | ICD-10-CM | POA: Diagnosis not present

## 2023-01-14 DIAGNOSIS — Z931 Gastrostomy status: Secondary | ICD-10-CM | POA: Diagnosis not present

## 2023-01-14 DIAGNOSIS — Z79630 Long term (current) use of alkylating agent: Secondary | ICD-10-CM | POA: Diagnosis not present

## 2023-01-14 DIAGNOSIS — K5900 Constipation, unspecified: Secondary | ICD-10-CM | POA: Diagnosis not present

## 2023-01-14 DIAGNOSIS — Z7689 Persons encountering health services in other specified circumstances: Secondary | ICD-10-CM | POA: Diagnosis not present

## 2023-01-17 DIAGNOSIS — C01 Malignant neoplasm of base of tongue: Secondary | ICD-10-CM | POA: Diagnosis not present

## 2023-01-17 DIAGNOSIS — Z7689 Persons encountering health services in other specified circumstances: Secondary | ICD-10-CM | POA: Diagnosis not present

## 2023-01-17 DIAGNOSIS — Z510 Encounter for antineoplastic radiation therapy: Secondary | ICD-10-CM | POA: Diagnosis not present

## 2023-01-17 DIAGNOSIS — Z931 Gastrostomy status: Secondary | ICD-10-CM | POA: Diagnosis not present

## 2023-01-17 DIAGNOSIS — Z79630 Long term (current) use of alkylating agent: Secondary | ICD-10-CM | POA: Diagnosis not present

## 2023-01-17 DIAGNOSIS — K5900 Constipation, unspecified: Secondary | ICD-10-CM | POA: Diagnosis not present

## 2023-01-18 DIAGNOSIS — Z931 Gastrostomy status: Secondary | ICD-10-CM | POA: Diagnosis not present

## 2023-01-18 DIAGNOSIS — K5900 Constipation, unspecified: Secondary | ICD-10-CM | POA: Diagnosis not present

## 2023-01-18 DIAGNOSIS — Z7689 Persons encountering health services in other specified circumstances: Secondary | ICD-10-CM | POA: Diagnosis not present

## 2023-01-18 DIAGNOSIS — C01 Malignant neoplasm of base of tongue: Secondary | ICD-10-CM | POA: Diagnosis not present

## 2023-01-18 DIAGNOSIS — Z510 Encounter for antineoplastic radiation therapy: Secondary | ICD-10-CM | POA: Diagnosis not present

## 2023-01-18 DIAGNOSIS — Z79630 Long term (current) use of alkylating agent: Secondary | ICD-10-CM | POA: Diagnosis not present

## 2023-01-19 DIAGNOSIS — R1314 Dysphagia, pharyngoesophageal phase: Secondary | ICD-10-CM | POA: Diagnosis not present

## 2023-01-19 DIAGNOSIS — Z431 Encounter for attention to gastrostomy: Secondary | ICD-10-CM | POA: Diagnosis not present

## 2023-01-19 DIAGNOSIS — E860 Dehydration: Secondary | ICD-10-CM | POA: Diagnosis not present

## 2023-01-19 DIAGNOSIS — R1310 Dysphagia, unspecified: Secondary | ICD-10-CM | POA: Diagnosis not present

## 2023-01-19 DIAGNOSIS — K5900 Constipation, unspecified: Secondary | ICD-10-CM | POA: Diagnosis not present

## 2023-01-19 DIAGNOSIS — J449 Chronic obstructive pulmonary disease, unspecified: Secondary | ICD-10-CM | POA: Diagnosis not present

## 2023-01-19 DIAGNOSIS — D509 Iron deficiency anemia, unspecified: Secondary | ICD-10-CM | POA: Diagnosis not present

## 2023-01-19 DIAGNOSIS — C01 Malignant neoplasm of base of tongue: Secondary | ICD-10-CM | POA: Diagnosis not present

## 2023-01-19 DIAGNOSIS — G893 Neoplasm related pain (acute) (chronic): Secondary | ICD-10-CM | POA: Diagnosis not present

## 2023-01-19 DIAGNOSIS — Z931 Gastrostomy status: Secondary | ICD-10-CM | POA: Diagnosis not present

## 2023-01-19 DIAGNOSIS — Z556 Problems related to health literacy: Secondary | ICD-10-CM | POA: Diagnosis not present

## 2023-01-19 DIAGNOSIS — Z510 Encounter for antineoplastic radiation therapy: Secondary | ICD-10-CM | POA: Diagnosis not present

## 2023-01-19 DIAGNOSIS — R638 Other symptoms and signs concerning food and fluid intake: Secondary | ICD-10-CM | POA: Diagnosis not present

## 2023-01-19 DIAGNOSIS — Z79630 Long term (current) use of alkylating agent: Secondary | ICD-10-CM | POA: Diagnosis not present

## 2023-01-19 DIAGNOSIS — E43 Unspecified severe protein-calorie malnutrition: Secondary | ICD-10-CM | POA: Diagnosis not present

## 2023-01-19 DIAGNOSIS — Z7689 Persons encountering health services in other specified circumstances: Secondary | ICD-10-CM | POA: Diagnosis not present

## 2023-01-19 DIAGNOSIS — Z87891 Personal history of nicotine dependence: Secondary | ICD-10-CM | POA: Diagnosis not present

## 2023-01-20 DIAGNOSIS — K5900 Constipation, unspecified: Secondary | ICD-10-CM | POA: Diagnosis not present

## 2023-01-20 DIAGNOSIS — Z510 Encounter for antineoplastic radiation therapy: Secondary | ICD-10-CM | POA: Diagnosis not present

## 2023-01-20 DIAGNOSIS — Z931 Gastrostomy status: Secondary | ICD-10-CM | POA: Diagnosis not present

## 2023-01-20 DIAGNOSIS — Z79630 Long term (current) use of alkylating agent: Secondary | ICD-10-CM | POA: Diagnosis not present

## 2023-01-20 DIAGNOSIS — Z7689 Persons encountering health services in other specified circumstances: Secondary | ICD-10-CM | POA: Diagnosis not present

## 2023-01-20 DIAGNOSIS — C01 Malignant neoplasm of base of tongue: Secondary | ICD-10-CM | POA: Diagnosis not present

## 2023-01-21 DIAGNOSIS — Z79630 Long term (current) use of alkylating agent: Secondary | ICD-10-CM | POA: Diagnosis not present

## 2023-01-21 DIAGNOSIS — C029 Malignant neoplasm of tongue, unspecified: Secondary | ICD-10-CM | POA: Diagnosis not present

## 2023-01-21 DIAGNOSIS — Z5111 Encounter for antineoplastic chemotherapy: Secondary | ICD-10-CM | POA: Diagnosis not present

## 2023-01-21 DIAGNOSIS — C01 Malignant neoplasm of base of tongue: Secondary | ICD-10-CM | POA: Diagnosis not present

## 2023-01-21 DIAGNOSIS — K5900 Constipation, unspecified: Secondary | ICD-10-CM | POA: Diagnosis not present

## 2023-01-21 DIAGNOSIS — Z931 Gastrostomy status: Secondary | ICD-10-CM | POA: Diagnosis not present

## 2023-01-21 DIAGNOSIS — Z510 Encounter for antineoplastic radiation therapy: Secondary | ICD-10-CM | POA: Diagnosis not present

## 2023-01-21 DIAGNOSIS — E860 Dehydration: Secondary | ICD-10-CM | POA: Diagnosis not present

## 2023-01-21 DIAGNOSIS — Z7689 Persons encountering health services in other specified circumstances: Secondary | ICD-10-CM | POA: Diagnosis not present

## 2023-01-24 DIAGNOSIS — Z7689 Persons encountering health services in other specified circumstances: Secondary | ICD-10-CM | POA: Diagnosis not present

## 2023-01-24 DIAGNOSIS — Z510 Encounter for antineoplastic radiation therapy: Secondary | ICD-10-CM | POA: Diagnosis not present

## 2023-01-24 DIAGNOSIS — Z5111 Encounter for antineoplastic chemotherapy: Secondary | ICD-10-CM | POA: Diagnosis not present

## 2023-01-24 DIAGNOSIS — Z931 Gastrostomy status: Secondary | ICD-10-CM | POA: Diagnosis not present

## 2023-01-24 DIAGNOSIS — C01 Malignant neoplasm of base of tongue: Secondary | ICD-10-CM | POA: Diagnosis not present

## 2023-01-24 DIAGNOSIS — E860 Dehydration: Secondary | ICD-10-CM | POA: Diagnosis not present

## 2023-01-24 DIAGNOSIS — Z79630 Long term (current) use of alkylating agent: Secondary | ICD-10-CM | POA: Diagnosis not present

## 2023-01-24 DIAGNOSIS — K5900 Constipation, unspecified: Secondary | ICD-10-CM | POA: Diagnosis not present

## 2023-01-25 DIAGNOSIS — Z79630 Long term (current) use of alkylating agent: Secondary | ICD-10-CM | POA: Diagnosis not present

## 2023-01-25 DIAGNOSIS — K5900 Constipation, unspecified: Secondary | ICD-10-CM | POA: Diagnosis not present

## 2023-01-25 DIAGNOSIS — C01 Malignant neoplasm of base of tongue: Secondary | ICD-10-CM | POA: Diagnosis not present

## 2023-01-25 DIAGNOSIS — Z931 Gastrostomy status: Secondary | ICD-10-CM | POA: Diagnosis not present

## 2023-01-25 DIAGNOSIS — Z7689 Persons encountering health services in other specified circumstances: Secondary | ICD-10-CM | POA: Diagnosis not present

## 2023-01-25 DIAGNOSIS — Z510 Encounter for antineoplastic radiation therapy: Secondary | ICD-10-CM | POA: Diagnosis not present

## 2023-01-26 DIAGNOSIS — R1314 Dysphagia, pharyngoesophageal phase: Secondary | ICD-10-CM | POA: Diagnosis not present

## 2023-01-26 DIAGNOSIS — Z431 Encounter for attention to gastrostomy: Secondary | ICD-10-CM | POA: Diagnosis not present

## 2023-01-26 DIAGNOSIS — Z87891 Personal history of nicotine dependence: Secondary | ICD-10-CM | POA: Diagnosis not present

## 2023-01-26 DIAGNOSIS — Z79630 Long term (current) use of alkylating agent: Secondary | ICD-10-CM | POA: Diagnosis not present

## 2023-01-26 DIAGNOSIS — Z510 Encounter for antineoplastic radiation therapy: Secondary | ICD-10-CM | POA: Diagnosis not present

## 2023-01-26 DIAGNOSIS — G893 Neoplasm related pain (acute) (chronic): Secondary | ICD-10-CM | POA: Diagnosis not present

## 2023-01-26 DIAGNOSIS — R1310 Dysphagia, unspecified: Secondary | ICD-10-CM | POA: Diagnosis not present

## 2023-01-26 DIAGNOSIS — Z931 Gastrostomy status: Secondary | ICD-10-CM | POA: Diagnosis not present

## 2023-01-26 DIAGNOSIS — J449 Chronic obstructive pulmonary disease, unspecified: Secondary | ICD-10-CM | POA: Diagnosis not present

## 2023-01-26 DIAGNOSIS — R638 Other symptoms and signs concerning food and fluid intake: Secondary | ICD-10-CM | POA: Diagnosis not present

## 2023-01-26 DIAGNOSIS — K5900 Constipation, unspecified: Secondary | ICD-10-CM | POA: Diagnosis not present

## 2023-01-26 DIAGNOSIS — E43 Unspecified severe protein-calorie malnutrition: Secondary | ICD-10-CM | POA: Diagnosis not present

## 2023-01-26 DIAGNOSIS — Z7689 Persons encountering health services in other specified circumstances: Secondary | ICD-10-CM | POA: Diagnosis not present

## 2023-01-26 DIAGNOSIS — C01 Malignant neoplasm of base of tongue: Secondary | ICD-10-CM | POA: Diagnosis not present

## 2023-01-26 DIAGNOSIS — E860 Dehydration: Secondary | ICD-10-CM | POA: Diagnosis not present

## 2023-01-26 DIAGNOSIS — D509 Iron deficiency anemia, unspecified: Secondary | ICD-10-CM | POA: Diagnosis not present

## 2023-01-26 DIAGNOSIS — Z556 Problems related to health literacy: Secondary | ICD-10-CM | POA: Diagnosis not present

## 2023-01-27 DIAGNOSIS — Z510 Encounter for antineoplastic radiation therapy: Secondary | ICD-10-CM | POA: Diagnosis not present

## 2023-01-27 DIAGNOSIS — C01 Malignant neoplasm of base of tongue: Secondary | ICD-10-CM | POA: Diagnosis not present

## 2023-01-27 DIAGNOSIS — Z79630 Long term (current) use of alkylating agent: Secondary | ICD-10-CM | POA: Diagnosis not present

## 2023-01-27 DIAGNOSIS — Z7689 Persons encountering health services in other specified circumstances: Secondary | ICD-10-CM | POA: Diagnosis not present

## 2023-01-27 DIAGNOSIS — Z931 Gastrostomy status: Secondary | ICD-10-CM | POA: Diagnosis not present

## 2023-01-27 DIAGNOSIS — K5900 Constipation, unspecified: Secondary | ICD-10-CM | POA: Diagnosis not present

## 2023-01-28 DIAGNOSIS — C01 Malignant neoplasm of base of tongue: Secondary | ICD-10-CM | POA: Diagnosis not present

## 2023-01-28 DIAGNOSIS — E860 Dehydration: Secondary | ICD-10-CM | POA: Diagnosis not present

## 2023-01-28 DIAGNOSIS — Z510 Encounter for antineoplastic radiation therapy: Secondary | ICD-10-CM | POA: Diagnosis not present

## 2023-01-28 DIAGNOSIS — K5900 Constipation, unspecified: Secondary | ICD-10-CM | POA: Diagnosis not present

## 2023-01-28 DIAGNOSIS — Z7689 Persons encountering health services in other specified circumstances: Secondary | ICD-10-CM | POA: Diagnosis not present

## 2023-01-28 DIAGNOSIS — Z79630 Long term (current) use of alkylating agent: Secondary | ICD-10-CM | POA: Diagnosis not present

## 2023-01-28 DIAGNOSIS — Z5111 Encounter for antineoplastic chemotherapy: Secondary | ICD-10-CM | POA: Diagnosis not present

## 2023-01-28 DIAGNOSIS — Z931 Gastrostomy status: Secondary | ICD-10-CM | POA: Diagnosis not present

## 2023-01-31 DIAGNOSIS — Z7689 Persons encountering health services in other specified circumstances: Secondary | ICD-10-CM | POA: Diagnosis not present

## 2023-01-31 DIAGNOSIS — K5900 Constipation, unspecified: Secondary | ICD-10-CM | POA: Diagnosis not present

## 2023-01-31 DIAGNOSIS — Z510 Encounter for antineoplastic radiation therapy: Secondary | ICD-10-CM | POA: Diagnosis not present

## 2023-01-31 DIAGNOSIS — C01 Malignant neoplasm of base of tongue: Secondary | ICD-10-CM | POA: Diagnosis not present

## 2023-01-31 DIAGNOSIS — Z931 Gastrostomy status: Secondary | ICD-10-CM | POA: Diagnosis not present

## 2023-01-31 DIAGNOSIS — Z79630 Long term (current) use of alkylating agent: Secondary | ICD-10-CM | POA: Diagnosis not present

## 2023-02-01 DIAGNOSIS — C01 Malignant neoplasm of base of tongue: Secondary | ICD-10-CM | POA: Diagnosis not present

## 2023-02-01 DIAGNOSIS — Z7689 Persons encountering health services in other specified circumstances: Secondary | ICD-10-CM | POA: Diagnosis not present

## 2023-02-01 DIAGNOSIS — Z79630 Long term (current) use of alkylating agent: Secondary | ICD-10-CM | POA: Diagnosis not present

## 2023-02-01 DIAGNOSIS — K5900 Constipation, unspecified: Secondary | ICD-10-CM | POA: Diagnosis not present

## 2023-02-01 DIAGNOSIS — Z931 Gastrostomy status: Secondary | ICD-10-CM | POA: Diagnosis not present

## 2023-02-01 DIAGNOSIS — Z510 Encounter for antineoplastic radiation therapy: Secondary | ICD-10-CM | POA: Diagnosis not present

## 2023-02-02 DIAGNOSIS — Z556 Problems related to health literacy: Secondary | ICD-10-CM | POA: Diagnosis not present

## 2023-02-02 DIAGNOSIS — Z7689 Persons encountering health services in other specified circumstances: Secondary | ICD-10-CM | POA: Diagnosis not present

## 2023-02-02 DIAGNOSIS — C01 Malignant neoplasm of base of tongue: Secondary | ICD-10-CM | POA: Diagnosis not present

## 2023-02-02 DIAGNOSIS — Z931 Gastrostomy status: Secondary | ICD-10-CM | POA: Diagnosis not present

## 2023-02-02 DIAGNOSIS — Z510 Encounter for antineoplastic radiation therapy: Secondary | ICD-10-CM | POA: Diagnosis not present

## 2023-02-02 DIAGNOSIS — R638 Other symptoms and signs concerning food and fluid intake: Secondary | ICD-10-CM | POA: Diagnosis not present

## 2023-02-02 DIAGNOSIS — Z79630 Long term (current) use of alkylating agent: Secondary | ICD-10-CM | POA: Diagnosis not present

## 2023-02-02 DIAGNOSIS — R1314 Dysphagia, pharyngoesophageal phase: Secondary | ICD-10-CM | POA: Diagnosis not present

## 2023-02-02 DIAGNOSIS — G893 Neoplasm related pain (acute) (chronic): Secondary | ICD-10-CM | POA: Diagnosis not present

## 2023-02-02 DIAGNOSIS — Z87891 Personal history of nicotine dependence: Secondary | ICD-10-CM | POA: Diagnosis not present

## 2023-02-02 DIAGNOSIS — Z431 Encounter for attention to gastrostomy: Secondary | ICD-10-CM | POA: Diagnosis not present

## 2023-02-02 DIAGNOSIS — R1310 Dysphagia, unspecified: Secondary | ICD-10-CM | POA: Diagnosis not present

## 2023-02-02 DIAGNOSIS — E43 Unspecified severe protein-calorie malnutrition: Secondary | ICD-10-CM | POA: Diagnosis not present

## 2023-02-02 DIAGNOSIS — K5900 Constipation, unspecified: Secondary | ICD-10-CM | POA: Diagnosis not present

## 2023-02-02 DIAGNOSIS — D509 Iron deficiency anemia, unspecified: Secondary | ICD-10-CM | POA: Diagnosis not present

## 2023-02-02 DIAGNOSIS — J449 Chronic obstructive pulmonary disease, unspecified: Secondary | ICD-10-CM | POA: Diagnosis not present

## 2023-02-02 DIAGNOSIS — E860 Dehydration: Secondary | ICD-10-CM | POA: Diagnosis not present

## 2023-02-03 DIAGNOSIS — K5900 Constipation, unspecified: Secondary | ICD-10-CM | POA: Diagnosis not present

## 2023-02-03 DIAGNOSIS — D509 Iron deficiency anemia, unspecified: Secondary | ICD-10-CM | POA: Diagnosis not present

## 2023-02-03 DIAGNOSIS — E860 Dehydration: Secondary | ICD-10-CM | POA: Diagnosis not present

## 2023-02-03 DIAGNOSIS — Z87891 Personal history of nicotine dependence: Secondary | ICD-10-CM | POA: Diagnosis not present

## 2023-02-03 DIAGNOSIS — Z79630 Long term (current) use of alkylating agent: Secondary | ICD-10-CM | POA: Diagnosis not present

## 2023-02-03 DIAGNOSIS — R638 Other symptoms and signs concerning food and fluid intake: Secondary | ICD-10-CM | POA: Diagnosis not present

## 2023-02-03 DIAGNOSIS — Z931 Gastrostomy status: Secondary | ICD-10-CM | POA: Diagnosis not present

## 2023-02-03 DIAGNOSIS — Z556 Problems related to health literacy: Secondary | ICD-10-CM | POA: Diagnosis not present

## 2023-02-03 DIAGNOSIS — R1310 Dysphagia, unspecified: Secondary | ICD-10-CM | POA: Diagnosis not present

## 2023-02-03 DIAGNOSIS — Z7689 Persons encountering health services in other specified circumstances: Secondary | ICD-10-CM | POA: Diagnosis not present

## 2023-02-03 DIAGNOSIS — C01 Malignant neoplasm of base of tongue: Secondary | ICD-10-CM | POA: Diagnosis not present

## 2023-02-03 DIAGNOSIS — Z431 Encounter for attention to gastrostomy: Secondary | ICD-10-CM | POA: Diagnosis not present

## 2023-02-03 DIAGNOSIS — Z510 Encounter for antineoplastic radiation therapy: Secondary | ICD-10-CM | POA: Diagnosis not present

## 2023-02-03 DIAGNOSIS — J449 Chronic obstructive pulmonary disease, unspecified: Secondary | ICD-10-CM | POA: Diagnosis not present

## 2023-02-03 DIAGNOSIS — E43 Unspecified severe protein-calorie malnutrition: Secondary | ICD-10-CM | POA: Diagnosis not present

## 2023-02-03 DIAGNOSIS — R1314 Dysphagia, pharyngoesophageal phase: Secondary | ICD-10-CM | POA: Diagnosis not present

## 2023-02-03 DIAGNOSIS — G893 Neoplasm related pain (acute) (chronic): Secondary | ICD-10-CM | POA: Diagnosis not present

## 2023-02-04 DIAGNOSIS — Z79630 Long term (current) use of alkylating agent: Secondary | ICD-10-CM | POA: Diagnosis not present

## 2023-02-04 DIAGNOSIS — C01 Malignant neoplasm of base of tongue: Secondary | ICD-10-CM | POA: Diagnosis not present

## 2023-02-04 DIAGNOSIS — Z931 Gastrostomy status: Secondary | ICD-10-CM | POA: Diagnosis not present

## 2023-02-04 DIAGNOSIS — Z7689 Persons encountering health services in other specified circumstances: Secondary | ICD-10-CM | POA: Diagnosis not present

## 2023-02-04 DIAGNOSIS — Z510 Encounter for antineoplastic radiation therapy: Secondary | ICD-10-CM | POA: Diagnosis not present

## 2023-02-04 DIAGNOSIS — K5900 Constipation, unspecified: Secondary | ICD-10-CM | POA: Diagnosis not present

## 2023-02-06 DIAGNOSIS — K5900 Constipation, unspecified: Secondary | ICD-10-CM | POA: Diagnosis not present

## 2023-02-06 DIAGNOSIS — Z79630 Long term (current) use of alkylating agent: Secondary | ICD-10-CM | POA: Diagnosis not present

## 2023-02-06 DIAGNOSIS — Z7689 Persons encountering health services in other specified circumstances: Secondary | ICD-10-CM | POA: Diagnosis not present

## 2023-02-06 DIAGNOSIS — C01 Malignant neoplasm of base of tongue: Secondary | ICD-10-CM | POA: Diagnosis not present

## 2023-02-06 DIAGNOSIS — Z931 Gastrostomy status: Secondary | ICD-10-CM | POA: Diagnosis not present

## 2023-02-06 DIAGNOSIS — Z510 Encounter for antineoplastic radiation therapy: Secondary | ICD-10-CM | POA: Diagnosis not present

## 2023-02-07 DIAGNOSIS — E860 Dehydration: Secondary | ICD-10-CM | POA: Diagnosis not present

## 2023-02-07 DIAGNOSIS — Z5111 Encounter for antineoplastic chemotherapy: Secondary | ICD-10-CM | POA: Diagnosis not present

## 2023-02-07 DIAGNOSIS — K5900 Constipation, unspecified: Secondary | ICD-10-CM | POA: Diagnosis not present

## 2023-02-07 DIAGNOSIS — C01 Malignant neoplasm of base of tongue: Secondary | ICD-10-CM | POA: Diagnosis not present

## 2023-02-07 DIAGNOSIS — Z510 Encounter for antineoplastic radiation therapy: Secondary | ICD-10-CM | POA: Diagnosis not present

## 2023-02-07 DIAGNOSIS — Z7689 Persons encountering health services in other specified circumstances: Secondary | ICD-10-CM | POA: Diagnosis not present

## 2023-02-07 DIAGNOSIS — Z931 Gastrostomy status: Secondary | ICD-10-CM | POA: Diagnosis not present

## 2023-02-07 DIAGNOSIS — Z79630 Long term (current) use of alkylating agent: Secondary | ICD-10-CM | POA: Diagnosis not present

## 2023-02-08 DIAGNOSIS — Z931 Gastrostomy status: Secondary | ICD-10-CM | POA: Diagnosis not present

## 2023-02-08 DIAGNOSIS — K5900 Constipation, unspecified: Secondary | ICD-10-CM | POA: Diagnosis not present

## 2023-02-08 DIAGNOSIS — Z556 Problems related to health literacy: Secondary | ICD-10-CM | POA: Diagnosis not present

## 2023-02-08 DIAGNOSIS — Z79630 Long term (current) use of alkylating agent: Secondary | ICD-10-CM | POA: Diagnosis not present

## 2023-02-08 DIAGNOSIS — R1310 Dysphagia, unspecified: Secondary | ICD-10-CM | POA: Diagnosis not present

## 2023-02-08 DIAGNOSIS — R638 Other symptoms and signs concerning food and fluid intake: Secondary | ICD-10-CM | POA: Diagnosis not present

## 2023-02-08 DIAGNOSIS — Z510 Encounter for antineoplastic radiation therapy: Secondary | ICD-10-CM | POA: Diagnosis not present

## 2023-02-08 DIAGNOSIS — J449 Chronic obstructive pulmonary disease, unspecified: Secondary | ICD-10-CM | POA: Diagnosis not present

## 2023-02-08 DIAGNOSIS — R1314 Dysphagia, pharyngoesophageal phase: Secondary | ICD-10-CM | POA: Diagnosis not present

## 2023-02-08 DIAGNOSIS — E43 Unspecified severe protein-calorie malnutrition: Secondary | ICD-10-CM | POA: Diagnosis not present

## 2023-02-08 DIAGNOSIS — Z87891 Personal history of nicotine dependence: Secondary | ICD-10-CM | POA: Diagnosis not present

## 2023-02-08 DIAGNOSIS — G893 Neoplasm related pain (acute) (chronic): Secondary | ICD-10-CM | POA: Diagnosis not present

## 2023-02-08 DIAGNOSIS — C01 Malignant neoplasm of base of tongue: Secondary | ICD-10-CM | POA: Diagnosis not present

## 2023-02-08 DIAGNOSIS — Z7689 Persons encountering health services in other specified circumstances: Secondary | ICD-10-CM | POA: Diagnosis not present

## 2023-02-08 DIAGNOSIS — Z431 Encounter for attention to gastrostomy: Secondary | ICD-10-CM | POA: Diagnosis not present

## 2023-02-08 DIAGNOSIS — D509 Iron deficiency anemia, unspecified: Secondary | ICD-10-CM | POA: Diagnosis not present

## 2023-02-08 DIAGNOSIS — E860 Dehydration: Secondary | ICD-10-CM | POA: Diagnosis not present

## 2023-02-09 DIAGNOSIS — Z931 Gastrostomy status: Secondary | ICD-10-CM | POA: Diagnosis not present

## 2023-02-09 DIAGNOSIS — C01 Malignant neoplasm of base of tongue: Secondary | ICD-10-CM | POA: Diagnosis not present

## 2023-02-09 DIAGNOSIS — K5900 Constipation, unspecified: Secondary | ICD-10-CM | POA: Diagnosis not present

## 2023-02-09 DIAGNOSIS — Z79630 Long term (current) use of alkylating agent: Secondary | ICD-10-CM | POA: Diagnosis not present

## 2023-02-09 DIAGNOSIS — Z510 Encounter for antineoplastic radiation therapy: Secondary | ICD-10-CM | POA: Diagnosis not present

## 2023-02-09 DIAGNOSIS — R64 Cachexia: Secondary | ICD-10-CM | POA: Diagnosis not present

## 2023-02-09 DIAGNOSIS — Z7689 Persons encountering health services in other specified circumstances: Secondary | ICD-10-CM | POA: Diagnosis not present

## 2023-02-11 DIAGNOSIS — C01 Malignant neoplasm of base of tongue: Secondary | ICD-10-CM | POA: Diagnosis not present

## 2023-02-11 DIAGNOSIS — Z5111 Encounter for antineoplastic chemotherapy: Secondary | ICD-10-CM | POA: Diagnosis not present

## 2023-02-11 DIAGNOSIS — Z7689 Persons encountering health services in other specified circumstances: Secondary | ICD-10-CM | POA: Diagnosis not present

## 2023-02-11 DIAGNOSIS — E860 Dehydration: Secondary | ICD-10-CM | POA: Diagnosis not present

## 2023-02-13 DIAGNOSIS — Z419 Encounter for procedure for purposes other than remedying health state, unspecified: Secondary | ICD-10-CM | POA: Diagnosis not present

## 2023-02-13 DIAGNOSIS — C01 Malignant neoplasm of base of tongue: Secondary | ICD-10-CM | POA: Diagnosis not present

## 2023-02-14 DIAGNOSIS — C01 Malignant neoplasm of base of tongue: Secondary | ICD-10-CM | POA: Diagnosis not present

## 2023-02-14 DIAGNOSIS — Z7689 Persons encountering health services in other specified circumstances: Secondary | ICD-10-CM | POA: Diagnosis not present

## 2023-02-14 DIAGNOSIS — Z510 Encounter for antineoplastic radiation therapy: Secondary | ICD-10-CM | POA: Diagnosis not present

## 2023-02-15 DIAGNOSIS — Z7689 Persons encountering health services in other specified circumstances: Secondary | ICD-10-CM | POA: Diagnosis not present

## 2023-02-15 DIAGNOSIS — Z510 Encounter for antineoplastic radiation therapy: Secondary | ICD-10-CM | POA: Diagnosis not present

## 2023-02-15 DIAGNOSIS — C01 Malignant neoplasm of base of tongue: Secondary | ICD-10-CM | POA: Diagnosis not present

## 2023-02-16 DIAGNOSIS — Z87891 Personal history of nicotine dependence: Secondary | ICD-10-CM | POA: Diagnosis not present

## 2023-02-16 DIAGNOSIS — R638 Other symptoms and signs concerning food and fluid intake: Secondary | ICD-10-CM | POA: Diagnosis not present

## 2023-02-16 DIAGNOSIS — R1314 Dysphagia, pharyngoesophageal phase: Secondary | ICD-10-CM | POA: Diagnosis not present

## 2023-02-16 DIAGNOSIS — Z431 Encounter for attention to gastrostomy: Secondary | ICD-10-CM | POA: Diagnosis not present

## 2023-02-16 DIAGNOSIS — E43 Unspecified severe protein-calorie malnutrition: Secondary | ICD-10-CM | POA: Diagnosis not present

## 2023-02-16 DIAGNOSIS — R1310 Dysphagia, unspecified: Secondary | ICD-10-CM | POA: Diagnosis not present

## 2023-02-16 DIAGNOSIS — Z556 Problems related to health literacy: Secondary | ICD-10-CM | POA: Diagnosis not present

## 2023-02-16 DIAGNOSIS — K5900 Constipation, unspecified: Secondary | ICD-10-CM | POA: Diagnosis not present

## 2023-02-16 DIAGNOSIS — G893 Neoplasm related pain (acute) (chronic): Secondary | ICD-10-CM | POA: Diagnosis not present

## 2023-02-16 DIAGNOSIS — E860 Dehydration: Secondary | ICD-10-CM | POA: Diagnosis not present

## 2023-02-16 DIAGNOSIS — J449 Chronic obstructive pulmonary disease, unspecified: Secondary | ICD-10-CM | POA: Diagnosis not present

## 2023-02-16 DIAGNOSIS — C01 Malignant neoplasm of base of tongue: Secondary | ICD-10-CM | POA: Diagnosis not present

## 2023-02-16 DIAGNOSIS — D509 Iron deficiency anemia, unspecified: Secondary | ICD-10-CM | POA: Diagnosis not present

## 2023-02-17 DIAGNOSIS — Z7689 Persons encountering health services in other specified circumstances: Secondary | ICD-10-CM | POA: Diagnosis not present

## 2023-02-17 DIAGNOSIS — R1314 Dysphagia, pharyngoesophageal phase: Secondary | ICD-10-CM | POA: Diagnosis not present

## 2023-02-17 DIAGNOSIS — Z556 Problems related to health literacy: Secondary | ICD-10-CM | POA: Diagnosis not present

## 2023-02-17 DIAGNOSIS — D509 Iron deficiency anemia, unspecified: Secondary | ICD-10-CM | POA: Diagnosis not present

## 2023-02-17 DIAGNOSIS — R1310 Dysphagia, unspecified: Secondary | ICD-10-CM | POA: Diagnosis not present

## 2023-02-17 DIAGNOSIS — E43 Unspecified severe protein-calorie malnutrition: Secondary | ICD-10-CM | POA: Diagnosis not present

## 2023-02-17 DIAGNOSIS — G893 Neoplasm related pain (acute) (chronic): Secondary | ICD-10-CM | POA: Diagnosis not present

## 2023-02-17 DIAGNOSIS — R638 Other symptoms and signs concerning food and fluid intake: Secondary | ICD-10-CM | POA: Diagnosis not present

## 2023-02-17 DIAGNOSIS — E860 Dehydration: Secondary | ICD-10-CM | POA: Diagnosis not present

## 2023-02-17 DIAGNOSIS — J449 Chronic obstructive pulmonary disease, unspecified: Secondary | ICD-10-CM | POA: Diagnosis not present

## 2023-02-17 DIAGNOSIS — Z431 Encounter for attention to gastrostomy: Secondary | ICD-10-CM | POA: Diagnosis not present

## 2023-02-17 DIAGNOSIS — C01 Malignant neoplasm of base of tongue: Secondary | ICD-10-CM | POA: Diagnosis not present

## 2023-02-17 DIAGNOSIS — Z87891 Personal history of nicotine dependence: Secondary | ICD-10-CM | POA: Diagnosis not present

## 2023-02-17 DIAGNOSIS — K5900 Constipation, unspecified: Secondary | ICD-10-CM | POA: Diagnosis not present

## 2023-02-18 DIAGNOSIS — Z7689 Persons encountering health services in other specified circumstances: Secondary | ICD-10-CM | POA: Diagnosis not present

## 2023-02-22 DIAGNOSIS — Z7689 Persons encountering health services in other specified circumstances: Secondary | ICD-10-CM | POA: Diagnosis not present

## 2023-02-23 DIAGNOSIS — E860 Dehydration: Secondary | ICD-10-CM | POA: Diagnosis not present

## 2023-02-23 DIAGNOSIS — Z556 Problems related to health literacy: Secondary | ICD-10-CM | POA: Diagnosis not present

## 2023-02-23 DIAGNOSIS — R1310 Dysphagia, unspecified: Secondary | ICD-10-CM | POA: Diagnosis not present

## 2023-02-23 DIAGNOSIS — C01 Malignant neoplasm of base of tongue: Secondary | ICD-10-CM | POA: Diagnosis not present

## 2023-02-23 DIAGNOSIS — J449 Chronic obstructive pulmonary disease, unspecified: Secondary | ICD-10-CM | POA: Diagnosis not present

## 2023-02-23 DIAGNOSIS — Z431 Encounter for attention to gastrostomy: Secondary | ICD-10-CM | POA: Diagnosis not present

## 2023-02-23 DIAGNOSIS — G893 Neoplasm related pain (acute) (chronic): Secondary | ICD-10-CM | POA: Diagnosis not present

## 2023-02-23 DIAGNOSIS — Z87891 Personal history of nicotine dependence: Secondary | ICD-10-CM | POA: Diagnosis not present

## 2023-02-23 DIAGNOSIS — K5900 Constipation, unspecified: Secondary | ICD-10-CM | POA: Diagnosis not present

## 2023-02-23 DIAGNOSIS — R638 Other symptoms and signs concerning food and fluid intake: Secondary | ICD-10-CM | POA: Diagnosis not present

## 2023-02-23 DIAGNOSIS — R1314 Dysphagia, pharyngoesophageal phase: Secondary | ICD-10-CM | POA: Diagnosis not present

## 2023-02-23 DIAGNOSIS — E43 Unspecified severe protein-calorie malnutrition: Secondary | ICD-10-CM | POA: Diagnosis not present

## 2023-02-23 DIAGNOSIS — D509 Iron deficiency anemia, unspecified: Secondary | ICD-10-CM | POA: Diagnosis not present

## 2023-02-24 DIAGNOSIS — E860 Dehydration: Secondary | ICD-10-CM | POA: Diagnosis not present

## 2023-02-24 DIAGNOSIS — Z87891 Personal history of nicotine dependence: Secondary | ICD-10-CM | POA: Diagnosis not present

## 2023-02-24 DIAGNOSIS — Z431 Encounter for attention to gastrostomy: Secondary | ICD-10-CM | POA: Diagnosis not present

## 2023-02-24 DIAGNOSIS — J449 Chronic obstructive pulmonary disease, unspecified: Secondary | ICD-10-CM | POA: Diagnosis not present

## 2023-02-24 DIAGNOSIS — R638 Other symptoms and signs concerning food and fluid intake: Secondary | ICD-10-CM | POA: Diagnosis not present

## 2023-02-24 DIAGNOSIS — R1310 Dysphagia, unspecified: Secondary | ICD-10-CM | POA: Diagnosis not present

## 2023-02-24 DIAGNOSIS — K5900 Constipation, unspecified: Secondary | ICD-10-CM | POA: Diagnosis not present

## 2023-02-24 DIAGNOSIS — G893 Neoplasm related pain (acute) (chronic): Secondary | ICD-10-CM | POA: Diagnosis not present

## 2023-02-24 DIAGNOSIS — R1314 Dysphagia, pharyngoesophageal phase: Secondary | ICD-10-CM | POA: Diagnosis not present

## 2023-02-24 DIAGNOSIS — D509 Iron deficiency anemia, unspecified: Secondary | ICD-10-CM | POA: Diagnosis not present

## 2023-02-24 DIAGNOSIS — E43 Unspecified severe protein-calorie malnutrition: Secondary | ICD-10-CM | POA: Diagnosis not present

## 2023-02-24 DIAGNOSIS — Z556 Problems related to health literacy: Secondary | ICD-10-CM | POA: Diagnosis not present

## 2023-02-24 DIAGNOSIS — C01 Malignant neoplasm of base of tongue: Secondary | ICD-10-CM | POA: Diagnosis not present

## 2023-03-02 DIAGNOSIS — Z87891 Personal history of nicotine dependence: Secondary | ICD-10-CM | POA: Diagnosis not present

## 2023-03-02 DIAGNOSIS — Z556 Problems related to health literacy: Secondary | ICD-10-CM | POA: Diagnosis not present

## 2023-03-02 DIAGNOSIS — C01 Malignant neoplasm of base of tongue: Secondary | ICD-10-CM | POA: Diagnosis not present

## 2023-03-02 DIAGNOSIS — D509 Iron deficiency anemia, unspecified: Secondary | ICD-10-CM | POA: Diagnosis not present

## 2023-03-02 DIAGNOSIS — K5900 Constipation, unspecified: Secondary | ICD-10-CM | POA: Diagnosis not present

## 2023-03-02 DIAGNOSIS — E43 Unspecified severe protein-calorie malnutrition: Secondary | ICD-10-CM | POA: Diagnosis not present

## 2023-03-02 DIAGNOSIS — R1310 Dysphagia, unspecified: Secondary | ICD-10-CM | POA: Diagnosis not present

## 2023-03-02 DIAGNOSIS — R1314 Dysphagia, pharyngoesophageal phase: Secondary | ICD-10-CM | POA: Diagnosis not present

## 2023-03-02 DIAGNOSIS — J449 Chronic obstructive pulmonary disease, unspecified: Secondary | ICD-10-CM | POA: Diagnosis not present

## 2023-03-02 DIAGNOSIS — Z431 Encounter for attention to gastrostomy: Secondary | ICD-10-CM | POA: Diagnosis not present

## 2023-03-02 DIAGNOSIS — R638 Other symptoms and signs concerning food and fluid intake: Secondary | ICD-10-CM | POA: Diagnosis not present

## 2023-03-02 DIAGNOSIS — E860 Dehydration: Secondary | ICD-10-CM | POA: Diagnosis not present

## 2023-03-02 DIAGNOSIS — G893 Neoplasm related pain (acute) (chronic): Secondary | ICD-10-CM | POA: Diagnosis not present

## 2023-03-03 DIAGNOSIS — K5900 Constipation, unspecified: Secondary | ICD-10-CM | POA: Diagnosis not present

## 2023-03-03 DIAGNOSIS — J449 Chronic obstructive pulmonary disease, unspecified: Secondary | ICD-10-CM | POA: Diagnosis not present

## 2023-03-03 DIAGNOSIS — Z431 Encounter for attention to gastrostomy: Secondary | ICD-10-CM | POA: Diagnosis not present

## 2023-03-03 DIAGNOSIS — R1314 Dysphagia, pharyngoesophageal phase: Secondary | ICD-10-CM | POA: Diagnosis not present

## 2023-03-03 DIAGNOSIS — E860 Dehydration: Secondary | ICD-10-CM | POA: Diagnosis not present

## 2023-03-03 DIAGNOSIS — G893 Neoplasm related pain (acute) (chronic): Secondary | ICD-10-CM | POA: Diagnosis not present

## 2023-03-03 DIAGNOSIS — R638 Other symptoms and signs concerning food and fluid intake: Secondary | ICD-10-CM | POA: Diagnosis not present

## 2023-03-03 DIAGNOSIS — Z87891 Personal history of nicotine dependence: Secondary | ICD-10-CM | POA: Diagnosis not present

## 2023-03-03 DIAGNOSIS — D509 Iron deficiency anemia, unspecified: Secondary | ICD-10-CM | POA: Diagnosis not present

## 2023-03-03 DIAGNOSIS — Z556 Problems related to health literacy: Secondary | ICD-10-CM | POA: Diagnosis not present

## 2023-03-03 DIAGNOSIS — E43 Unspecified severe protein-calorie malnutrition: Secondary | ICD-10-CM | POA: Diagnosis not present

## 2023-03-03 DIAGNOSIS — C01 Malignant neoplasm of base of tongue: Secondary | ICD-10-CM | POA: Diagnosis not present

## 2023-03-03 DIAGNOSIS — R1310 Dysphagia, unspecified: Secondary | ICD-10-CM | POA: Diagnosis not present

## 2023-03-07 DIAGNOSIS — Z7689 Persons encountering health services in other specified circumstances: Secondary | ICD-10-CM | POA: Diagnosis not present

## 2023-03-07 DIAGNOSIS — C01 Malignant neoplasm of base of tongue: Secondary | ICD-10-CM | POA: Diagnosis not present

## 2023-03-07 DIAGNOSIS — Z431 Encounter for attention to gastrostomy: Secondary | ICD-10-CM | POA: Diagnosis not present

## 2023-03-07 DIAGNOSIS — R1314 Dysphagia, pharyngoesophageal phase: Secondary | ICD-10-CM | POA: Diagnosis not present

## 2023-03-07 DIAGNOSIS — E43 Unspecified severe protein-calorie malnutrition: Secondary | ICD-10-CM | POA: Diagnosis not present

## 2023-03-07 DIAGNOSIS — R1310 Dysphagia, unspecified: Secondary | ICD-10-CM | POA: Diagnosis not present

## 2023-03-07 DIAGNOSIS — G893 Neoplasm related pain (acute) (chronic): Secondary | ICD-10-CM | POA: Diagnosis not present

## 2023-03-07 DIAGNOSIS — Z87891 Personal history of nicotine dependence: Secondary | ICD-10-CM | POA: Diagnosis not present

## 2023-03-07 DIAGNOSIS — K5900 Constipation, unspecified: Secondary | ICD-10-CM | POA: Diagnosis not present

## 2023-03-07 DIAGNOSIS — J449 Chronic obstructive pulmonary disease, unspecified: Secondary | ICD-10-CM | POA: Diagnosis not present

## 2023-03-07 DIAGNOSIS — R638 Other symptoms and signs concerning food and fluid intake: Secondary | ICD-10-CM | POA: Diagnosis not present

## 2023-03-07 DIAGNOSIS — E860 Dehydration: Secondary | ICD-10-CM | POA: Diagnosis not present

## 2023-03-07 DIAGNOSIS — D509 Iron deficiency anemia, unspecified: Secondary | ICD-10-CM | POA: Diagnosis not present

## 2023-03-07 DIAGNOSIS — Z556 Problems related to health literacy: Secondary | ICD-10-CM | POA: Diagnosis not present

## 2023-03-08 DIAGNOSIS — K5900 Constipation, unspecified: Secondary | ICD-10-CM | POA: Diagnosis not present

## 2023-03-08 DIAGNOSIS — R1310 Dysphagia, unspecified: Secondary | ICD-10-CM | POA: Diagnosis not present

## 2023-03-08 DIAGNOSIS — R638 Other symptoms and signs concerning food and fluid intake: Secondary | ICD-10-CM | POA: Diagnosis not present

## 2023-03-08 DIAGNOSIS — Z431 Encounter for attention to gastrostomy: Secondary | ICD-10-CM | POA: Diagnosis not present

## 2023-03-08 DIAGNOSIS — C01 Malignant neoplasm of base of tongue: Secondary | ICD-10-CM | POA: Diagnosis not present

## 2023-03-08 DIAGNOSIS — E43 Unspecified severe protein-calorie malnutrition: Secondary | ICD-10-CM | POA: Diagnosis not present

## 2023-03-08 DIAGNOSIS — E860 Dehydration: Secondary | ICD-10-CM | POA: Diagnosis not present

## 2023-03-08 DIAGNOSIS — G893 Neoplasm related pain (acute) (chronic): Secondary | ICD-10-CM | POA: Diagnosis not present

## 2023-03-08 DIAGNOSIS — J449 Chronic obstructive pulmonary disease, unspecified: Secondary | ICD-10-CM | POA: Diagnosis not present

## 2023-03-08 DIAGNOSIS — R1314 Dysphagia, pharyngoesophageal phase: Secondary | ICD-10-CM | POA: Diagnosis not present

## 2023-03-08 DIAGNOSIS — Z87891 Personal history of nicotine dependence: Secondary | ICD-10-CM | POA: Diagnosis not present

## 2023-03-08 DIAGNOSIS — Z556 Problems related to health literacy: Secondary | ICD-10-CM | POA: Diagnosis not present

## 2023-03-08 DIAGNOSIS — D509 Iron deficiency anemia, unspecified: Secondary | ICD-10-CM | POA: Diagnosis not present

## 2023-03-11 DIAGNOSIS — C01 Malignant neoplasm of base of tongue: Secondary | ICD-10-CM | POA: Diagnosis not present

## 2023-03-16 DIAGNOSIS — R1314 Dysphagia, pharyngoesophageal phase: Secondary | ICD-10-CM | POA: Diagnosis not present

## 2023-03-16 DIAGNOSIS — R1310 Dysphagia, unspecified: Secondary | ICD-10-CM | POA: Diagnosis not present

## 2023-03-16 DIAGNOSIS — C01 Malignant neoplasm of base of tongue: Secondary | ICD-10-CM | POA: Diagnosis not present

## 2023-03-16 DIAGNOSIS — E43 Unspecified severe protein-calorie malnutrition: Secondary | ICD-10-CM | POA: Diagnosis not present

## 2023-03-16 DIAGNOSIS — R638 Other symptoms and signs concerning food and fluid intake: Secondary | ICD-10-CM | POA: Diagnosis not present

## 2023-03-16 DIAGNOSIS — Z431 Encounter for attention to gastrostomy: Secondary | ICD-10-CM | POA: Diagnosis not present

## 2023-03-16 DIAGNOSIS — G893 Neoplasm related pain (acute) (chronic): Secondary | ICD-10-CM | POA: Diagnosis not present

## 2023-03-16 DIAGNOSIS — E860 Dehydration: Secondary | ICD-10-CM | POA: Diagnosis not present

## 2023-03-16 DIAGNOSIS — D509 Iron deficiency anemia, unspecified: Secondary | ICD-10-CM | POA: Diagnosis not present

## 2023-03-16 DIAGNOSIS — J449 Chronic obstructive pulmonary disease, unspecified: Secondary | ICD-10-CM | POA: Diagnosis not present

## 2023-03-16 DIAGNOSIS — Z87891 Personal history of nicotine dependence: Secondary | ICD-10-CM | POA: Diagnosis not present

## 2023-03-16 DIAGNOSIS — Z419 Encounter for procedure for purposes other than remedying health state, unspecified: Secondary | ICD-10-CM | POA: Diagnosis not present

## 2023-03-16 DIAGNOSIS — K5900 Constipation, unspecified: Secondary | ICD-10-CM | POA: Diagnosis not present

## 2023-03-16 DIAGNOSIS — Z556 Problems related to health literacy: Secondary | ICD-10-CM | POA: Diagnosis not present

## 2023-03-23 DIAGNOSIS — J449 Chronic obstructive pulmonary disease, unspecified: Secondary | ICD-10-CM | POA: Diagnosis not present

## 2023-03-23 DIAGNOSIS — K5900 Constipation, unspecified: Secondary | ICD-10-CM | POA: Diagnosis not present

## 2023-03-23 DIAGNOSIS — R638 Other symptoms and signs concerning food and fluid intake: Secondary | ICD-10-CM | POA: Diagnosis not present

## 2023-03-23 DIAGNOSIS — R1314 Dysphagia, pharyngoesophageal phase: Secondary | ICD-10-CM | POA: Diagnosis not present

## 2023-03-23 DIAGNOSIS — E43 Unspecified severe protein-calorie malnutrition: Secondary | ICD-10-CM | POA: Diagnosis not present

## 2023-03-23 DIAGNOSIS — Z431 Encounter for attention to gastrostomy: Secondary | ICD-10-CM | POA: Diagnosis not present

## 2023-03-23 DIAGNOSIS — Z556 Problems related to health literacy: Secondary | ICD-10-CM | POA: Diagnosis not present

## 2023-03-23 DIAGNOSIS — D509 Iron deficiency anemia, unspecified: Secondary | ICD-10-CM | POA: Diagnosis not present

## 2023-03-23 DIAGNOSIS — C01 Malignant neoplasm of base of tongue: Secondary | ICD-10-CM | POA: Diagnosis not present

## 2023-03-23 DIAGNOSIS — G893 Neoplasm related pain (acute) (chronic): Secondary | ICD-10-CM | POA: Diagnosis not present

## 2023-03-23 DIAGNOSIS — I10 Essential (primary) hypertension: Secondary | ICD-10-CM | POA: Diagnosis not present

## 2023-03-23 DIAGNOSIS — E860 Dehydration: Secondary | ICD-10-CM | POA: Diagnosis not present

## 2023-03-23 DIAGNOSIS — Z87891 Personal history of nicotine dependence: Secondary | ICD-10-CM | POA: Diagnosis not present

## 2023-03-30 DIAGNOSIS — G893 Neoplasm related pain (acute) (chronic): Secondary | ICD-10-CM | POA: Diagnosis not present

## 2023-03-30 DIAGNOSIS — J449 Chronic obstructive pulmonary disease, unspecified: Secondary | ICD-10-CM | POA: Diagnosis not present

## 2023-03-30 DIAGNOSIS — E860 Dehydration: Secondary | ICD-10-CM | POA: Diagnosis not present

## 2023-03-30 DIAGNOSIS — Z556 Problems related to health literacy: Secondary | ICD-10-CM | POA: Diagnosis not present

## 2023-03-30 DIAGNOSIS — Z87891 Personal history of nicotine dependence: Secondary | ICD-10-CM | POA: Diagnosis not present

## 2023-03-30 DIAGNOSIS — E43 Unspecified severe protein-calorie malnutrition: Secondary | ICD-10-CM | POA: Diagnosis not present

## 2023-03-30 DIAGNOSIS — D509 Iron deficiency anemia, unspecified: Secondary | ICD-10-CM | POA: Diagnosis not present

## 2023-03-30 DIAGNOSIS — R638 Other symptoms and signs concerning food and fluid intake: Secondary | ICD-10-CM | POA: Diagnosis not present

## 2023-03-30 DIAGNOSIS — C01 Malignant neoplasm of base of tongue: Secondary | ICD-10-CM | POA: Diagnosis not present

## 2023-03-30 DIAGNOSIS — K5900 Constipation, unspecified: Secondary | ICD-10-CM | POA: Diagnosis not present

## 2023-03-30 DIAGNOSIS — I10 Essential (primary) hypertension: Secondary | ICD-10-CM | POA: Diagnosis not present

## 2023-03-30 DIAGNOSIS — R1314 Dysphagia, pharyngoesophageal phase: Secondary | ICD-10-CM | POA: Diagnosis not present

## 2023-03-30 DIAGNOSIS — Z431 Encounter for attention to gastrostomy: Secondary | ICD-10-CM | POA: Diagnosis not present

## 2023-04-01 DIAGNOSIS — Z431 Encounter for attention to gastrostomy: Secondary | ICD-10-CM | POA: Diagnosis not present

## 2023-04-01 DIAGNOSIS — C01 Malignant neoplasm of base of tongue: Secondary | ICD-10-CM | POA: Diagnosis not present

## 2023-04-06 DIAGNOSIS — Z556 Problems related to health literacy: Secondary | ICD-10-CM | POA: Diagnosis not present

## 2023-04-06 DIAGNOSIS — K5900 Constipation, unspecified: Secondary | ICD-10-CM | POA: Diagnosis not present

## 2023-04-06 DIAGNOSIS — Z87891 Personal history of nicotine dependence: Secondary | ICD-10-CM | POA: Diagnosis not present

## 2023-04-06 DIAGNOSIS — I10 Essential (primary) hypertension: Secondary | ICD-10-CM | POA: Diagnosis not present

## 2023-04-06 DIAGNOSIS — R638 Other symptoms and signs concerning food and fluid intake: Secondary | ICD-10-CM | POA: Diagnosis not present

## 2023-04-06 DIAGNOSIS — E43 Unspecified severe protein-calorie malnutrition: Secondary | ICD-10-CM | POA: Diagnosis not present

## 2023-04-06 DIAGNOSIS — J449 Chronic obstructive pulmonary disease, unspecified: Secondary | ICD-10-CM | POA: Diagnosis not present

## 2023-04-06 DIAGNOSIS — D509 Iron deficiency anemia, unspecified: Secondary | ICD-10-CM | POA: Diagnosis not present

## 2023-04-06 DIAGNOSIS — Z431 Encounter for attention to gastrostomy: Secondary | ICD-10-CM | POA: Diagnosis not present

## 2023-04-06 DIAGNOSIS — C01 Malignant neoplasm of base of tongue: Secondary | ICD-10-CM | POA: Diagnosis not present

## 2023-04-06 DIAGNOSIS — R1314 Dysphagia, pharyngoesophageal phase: Secondary | ICD-10-CM | POA: Diagnosis not present

## 2023-04-06 DIAGNOSIS — E860 Dehydration: Secondary | ICD-10-CM | POA: Diagnosis not present

## 2023-04-06 DIAGNOSIS — G893 Neoplasm related pain (acute) (chronic): Secondary | ICD-10-CM | POA: Diagnosis not present

## 2023-04-11 DIAGNOSIS — C01 Malignant neoplasm of base of tongue: Secondary | ICD-10-CM | POA: Diagnosis not present

## 2023-04-13 DIAGNOSIS — R1314 Dysphagia, pharyngoesophageal phase: Secondary | ICD-10-CM | POA: Diagnosis not present

## 2023-04-13 DIAGNOSIS — Z556 Problems related to health literacy: Secondary | ICD-10-CM | POA: Diagnosis not present

## 2023-04-13 DIAGNOSIS — J449 Chronic obstructive pulmonary disease, unspecified: Secondary | ICD-10-CM | POA: Diagnosis not present

## 2023-04-13 DIAGNOSIS — K5900 Constipation, unspecified: Secondary | ICD-10-CM | POA: Diagnosis not present

## 2023-04-13 DIAGNOSIS — Z431 Encounter for attention to gastrostomy: Secondary | ICD-10-CM | POA: Diagnosis not present

## 2023-04-13 DIAGNOSIS — I10 Essential (primary) hypertension: Secondary | ICD-10-CM | POA: Diagnosis not present

## 2023-04-13 DIAGNOSIS — Z87891 Personal history of nicotine dependence: Secondary | ICD-10-CM | POA: Diagnosis not present

## 2023-04-13 DIAGNOSIS — E43 Unspecified severe protein-calorie malnutrition: Secondary | ICD-10-CM | POA: Diagnosis not present

## 2023-04-13 DIAGNOSIS — E860 Dehydration: Secondary | ICD-10-CM | POA: Diagnosis not present

## 2023-04-13 DIAGNOSIS — R638 Other symptoms and signs concerning food and fluid intake: Secondary | ICD-10-CM | POA: Diagnosis not present

## 2023-04-13 DIAGNOSIS — G893 Neoplasm related pain (acute) (chronic): Secondary | ICD-10-CM | POA: Diagnosis not present

## 2023-04-13 DIAGNOSIS — D509 Iron deficiency anemia, unspecified: Secondary | ICD-10-CM | POA: Diagnosis not present

## 2023-04-13 DIAGNOSIS — C01 Malignant neoplasm of base of tongue: Secondary | ICD-10-CM | POA: Diagnosis not present

## 2023-04-14 DIAGNOSIS — R490 Dysphonia: Secondary | ICD-10-CM | POA: Diagnosis not present

## 2023-04-14 DIAGNOSIS — C01 Malignant neoplasm of base of tongue: Secondary | ICD-10-CM | POA: Diagnosis not present

## 2023-04-14 DIAGNOSIS — R1314 Dysphagia, pharyngoesophageal phase: Secondary | ICD-10-CM | POA: Diagnosis not present

## 2023-04-14 DIAGNOSIS — Z7689 Persons encountering health services in other specified circumstances: Secondary | ICD-10-CM | POA: Diagnosis not present

## 2023-04-15 DIAGNOSIS — Z7689 Persons encountering health services in other specified circumstances: Secondary | ICD-10-CM | POA: Diagnosis not present

## 2023-04-16 DIAGNOSIS — C01 Malignant neoplasm of base of tongue: Secondary | ICD-10-CM | POA: Diagnosis not present

## 2023-04-16 DIAGNOSIS — Z419 Encounter for procedure for purposes other than remedying health state, unspecified: Secondary | ICD-10-CM | POA: Diagnosis not present

## 2023-04-20 DIAGNOSIS — E860 Dehydration: Secondary | ICD-10-CM | POA: Diagnosis not present

## 2023-04-20 DIAGNOSIS — R638 Other symptoms and signs concerning food and fluid intake: Secondary | ICD-10-CM | POA: Diagnosis not present

## 2023-04-20 DIAGNOSIS — R1314 Dysphagia, pharyngoesophageal phase: Secondary | ICD-10-CM | POA: Diagnosis not present

## 2023-04-20 DIAGNOSIS — D509 Iron deficiency anemia, unspecified: Secondary | ICD-10-CM | POA: Diagnosis not present

## 2023-04-20 DIAGNOSIS — K5900 Constipation, unspecified: Secondary | ICD-10-CM | POA: Diagnosis not present

## 2023-04-20 DIAGNOSIS — Z556 Problems related to health literacy: Secondary | ICD-10-CM | POA: Diagnosis not present

## 2023-04-20 DIAGNOSIS — Z87891 Personal history of nicotine dependence: Secondary | ICD-10-CM | POA: Diagnosis not present

## 2023-04-20 DIAGNOSIS — I10 Essential (primary) hypertension: Secondary | ICD-10-CM | POA: Diagnosis not present

## 2023-04-20 DIAGNOSIS — C01 Malignant neoplasm of base of tongue: Secondary | ICD-10-CM | POA: Diagnosis not present

## 2023-04-20 DIAGNOSIS — E43 Unspecified severe protein-calorie malnutrition: Secondary | ICD-10-CM | POA: Diagnosis not present

## 2023-04-20 DIAGNOSIS — G893 Neoplasm related pain (acute) (chronic): Secondary | ICD-10-CM | POA: Diagnosis not present

## 2023-04-20 DIAGNOSIS — J449 Chronic obstructive pulmonary disease, unspecified: Secondary | ICD-10-CM | POA: Diagnosis not present

## 2023-04-20 DIAGNOSIS — Z431 Encounter for attention to gastrostomy: Secondary | ICD-10-CM | POA: Diagnosis not present

## 2023-05-05 DIAGNOSIS — D509 Iron deficiency anemia, unspecified: Secondary | ICD-10-CM | POA: Diagnosis not present

## 2023-05-05 DIAGNOSIS — G893 Neoplasm related pain (acute) (chronic): Secondary | ICD-10-CM | POA: Diagnosis not present

## 2023-05-05 DIAGNOSIS — Z87891 Personal history of nicotine dependence: Secondary | ICD-10-CM | POA: Diagnosis not present

## 2023-05-05 DIAGNOSIS — J449 Chronic obstructive pulmonary disease, unspecified: Secondary | ICD-10-CM | POA: Diagnosis not present

## 2023-05-05 DIAGNOSIS — Z556 Problems related to health literacy: Secondary | ICD-10-CM | POA: Diagnosis not present

## 2023-05-05 DIAGNOSIS — E43 Unspecified severe protein-calorie malnutrition: Secondary | ICD-10-CM | POA: Diagnosis not present

## 2023-05-05 DIAGNOSIS — K5900 Constipation, unspecified: Secondary | ICD-10-CM | POA: Diagnosis not present

## 2023-05-05 DIAGNOSIS — R1314 Dysphagia, pharyngoesophageal phase: Secondary | ICD-10-CM | POA: Diagnosis not present

## 2023-05-05 DIAGNOSIS — E860 Dehydration: Secondary | ICD-10-CM | POA: Diagnosis not present

## 2023-05-05 DIAGNOSIS — C01 Malignant neoplasm of base of tongue: Secondary | ICD-10-CM | POA: Diagnosis not present

## 2023-05-05 DIAGNOSIS — Z431 Encounter for attention to gastrostomy: Secondary | ICD-10-CM | POA: Diagnosis not present

## 2023-05-05 DIAGNOSIS — R638 Other symptoms and signs concerning food and fluid intake: Secondary | ICD-10-CM | POA: Diagnosis not present

## 2023-05-05 DIAGNOSIS — I10 Essential (primary) hypertension: Secondary | ICD-10-CM | POA: Diagnosis not present

## 2023-05-11 DIAGNOSIS — R590 Localized enlarged lymph nodes: Secondary | ICD-10-CM | POA: Diagnosis not present

## 2023-05-11 DIAGNOSIS — E860 Dehydration: Secondary | ICD-10-CM | POA: Diagnosis not present

## 2023-05-11 DIAGNOSIS — Z923 Personal history of irradiation: Secondary | ICD-10-CM | POA: Diagnosis not present

## 2023-05-11 DIAGNOSIS — E43 Unspecified severe protein-calorie malnutrition: Secondary | ICD-10-CM | POA: Diagnosis not present

## 2023-05-11 DIAGNOSIS — Z87891 Personal history of nicotine dependence: Secondary | ICD-10-CM | POA: Diagnosis not present

## 2023-05-11 DIAGNOSIS — K5900 Constipation, unspecified: Secondary | ICD-10-CM | POA: Diagnosis not present

## 2023-05-11 DIAGNOSIS — R918 Other nonspecific abnormal finding of lung field: Secondary | ICD-10-CM | POA: Diagnosis not present

## 2023-05-11 DIAGNOSIS — Z931 Gastrostomy status: Secondary | ICD-10-CM | POA: Diagnosis not present

## 2023-05-11 DIAGNOSIS — Z7689 Persons encountering health services in other specified circumstances: Secondary | ICD-10-CM | POA: Diagnosis not present

## 2023-05-11 DIAGNOSIS — Z431 Encounter for attention to gastrostomy: Secondary | ICD-10-CM | POA: Diagnosis not present

## 2023-05-11 DIAGNOSIS — Z556 Problems related to health literacy: Secondary | ICD-10-CM | POA: Diagnosis not present

## 2023-05-11 DIAGNOSIS — R638 Other symptoms and signs concerning food and fluid intake: Secondary | ICD-10-CM | POA: Diagnosis not present

## 2023-05-11 DIAGNOSIS — C01 Malignant neoplasm of base of tongue: Secondary | ICD-10-CM | POA: Diagnosis not present

## 2023-05-11 DIAGNOSIS — G893 Neoplasm related pain (acute) (chronic): Secondary | ICD-10-CM | POA: Diagnosis not present

## 2023-05-11 DIAGNOSIS — R911 Solitary pulmonary nodule: Secondary | ICD-10-CM | POA: Diagnosis not present

## 2023-05-11 DIAGNOSIS — J449 Chronic obstructive pulmonary disease, unspecified: Secondary | ICD-10-CM | POA: Diagnosis not present

## 2023-05-11 DIAGNOSIS — D509 Iron deficiency anemia, unspecified: Secondary | ICD-10-CM | POA: Diagnosis not present

## 2023-05-11 DIAGNOSIS — R1314 Dysphagia, pharyngoesophageal phase: Secondary | ICD-10-CM | POA: Diagnosis not present

## 2023-05-11 DIAGNOSIS — I10 Essential (primary) hypertension: Secondary | ICD-10-CM | POA: Diagnosis not present

## 2023-05-14 DIAGNOSIS — C01 Malignant neoplasm of base of tongue: Secondary | ICD-10-CM | POA: Diagnosis not present

## 2023-05-14 DIAGNOSIS — Z419 Encounter for procedure for purposes other than remedying health state, unspecified: Secondary | ICD-10-CM | POA: Diagnosis not present

## 2023-05-17 DIAGNOSIS — R1312 Dysphagia, oropharyngeal phase: Secondary | ICD-10-CM | POA: Diagnosis not present

## 2023-05-17 DIAGNOSIS — C01 Malignant neoplasm of base of tongue: Secondary | ICD-10-CM | POA: Diagnosis not present

## 2023-05-17 DIAGNOSIS — Z7689 Persons encountering health services in other specified circumstances: Secondary | ICD-10-CM | POA: Diagnosis not present

## 2023-05-18 DIAGNOSIS — E43 Unspecified severe protein-calorie malnutrition: Secondary | ICD-10-CM | POA: Diagnosis not present

## 2023-05-18 DIAGNOSIS — Z7689 Persons encountering health services in other specified circumstances: Secondary | ICD-10-CM | POA: Diagnosis not present

## 2023-05-18 DIAGNOSIS — J449 Chronic obstructive pulmonary disease, unspecified: Secondary | ICD-10-CM | POA: Diagnosis not present

## 2023-05-18 DIAGNOSIS — R1314 Dysphagia, pharyngoesophageal phase: Secondary | ICD-10-CM | POA: Diagnosis not present

## 2023-05-18 DIAGNOSIS — Z431 Encounter for attention to gastrostomy: Secondary | ICD-10-CM | POA: Diagnosis not present

## 2023-05-18 DIAGNOSIS — Z87891 Personal history of nicotine dependence: Secondary | ICD-10-CM | POA: Diagnosis not present

## 2023-05-18 DIAGNOSIS — R638 Other symptoms and signs concerning food and fluid intake: Secondary | ICD-10-CM | POA: Diagnosis not present

## 2023-05-18 DIAGNOSIS — K5900 Constipation, unspecified: Secondary | ICD-10-CM | POA: Diagnosis not present

## 2023-05-18 DIAGNOSIS — D509 Iron deficiency anemia, unspecified: Secondary | ICD-10-CM | POA: Diagnosis not present

## 2023-05-18 DIAGNOSIS — C01 Malignant neoplasm of base of tongue: Secondary | ICD-10-CM | POA: Diagnosis not present

## 2023-05-18 DIAGNOSIS — E860 Dehydration: Secondary | ICD-10-CM | POA: Diagnosis not present

## 2023-05-18 DIAGNOSIS — Z923 Personal history of irradiation: Secondary | ICD-10-CM | POA: Diagnosis not present

## 2023-05-18 DIAGNOSIS — Z556 Problems related to health literacy: Secondary | ICD-10-CM | POA: Diagnosis not present

## 2023-05-18 DIAGNOSIS — G893 Neoplasm related pain (acute) (chronic): Secondary | ICD-10-CM | POA: Diagnosis not present

## 2023-05-18 DIAGNOSIS — I10 Essential (primary) hypertension: Secondary | ICD-10-CM | POA: Diagnosis not present

## 2023-05-25 DIAGNOSIS — I10 Essential (primary) hypertension: Secondary | ICD-10-CM | POA: Diagnosis not present

## 2023-05-25 DIAGNOSIS — D509 Iron deficiency anemia, unspecified: Secondary | ICD-10-CM | POA: Diagnosis not present

## 2023-05-25 DIAGNOSIS — J449 Chronic obstructive pulmonary disease, unspecified: Secondary | ICD-10-CM | POA: Diagnosis not present

## 2023-05-25 DIAGNOSIS — G893 Neoplasm related pain (acute) (chronic): Secondary | ICD-10-CM | POA: Diagnosis not present

## 2023-05-25 DIAGNOSIS — K5900 Constipation, unspecified: Secondary | ICD-10-CM | POA: Diagnosis not present

## 2023-05-25 DIAGNOSIS — Z87891 Personal history of nicotine dependence: Secondary | ICD-10-CM | POA: Diagnosis not present

## 2023-05-25 DIAGNOSIS — E43 Unspecified severe protein-calorie malnutrition: Secondary | ICD-10-CM | POA: Diagnosis not present

## 2023-05-25 DIAGNOSIS — C01 Malignant neoplasm of base of tongue: Secondary | ICD-10-CM | POA: Diagnosis not present

## 2023-05-25 DIAGNOSIS — I700 Atherosclerosis of aorta: Secondary | ICD-10-CM | POA: Diagnosis not present

## 2023-05-25 DIAGNOSIS — R1314 Dysphagia, pharyngoesophageal phase: Secondary | ICD-10-CM | POA: Diagnosis not present

## 2023-06-07 DIAGNOSIS — Z7689 Persons encountering health services in other specified circumstances: Secondary | ICD-10-CM | POA: Diagnosis not present

## 2023-06-14 ENCOUNTER — Ambulatory Visit: Attending: Oncology | Admitting: Speech Pathology

## 2023-06-14 DIAGNOSIS — R471 Dysarthria and anarthria: Secondary | ICD-10-CM | POA: Insufficient documentation

## 2023-06-14 DIAGNOSIS — R1312 Dysphagia, oropharyngeal phase: Secondary | ICD-10-CM | POA: Insufficient documentation

## 2023-06-14 DIAGNOSIS — C01 Malignant neoplasm of base of tongue: Secondary | ICD-10-CM | POA: Diagnosis not present

## 2023-06-15 DIAGNOSIS — C01 Malignant neoplasm of base of tongue: Secondary | ICD-10-CM | POA: Diagnosis not present

## 2023-06-22 ENCOUNTER — Ambulatory Visit: Admitting: Speech Pathology

## 2023-06-22 ENCOUNTER — Other Ambulatory Visit: Payer: Self-pay

## 2023-06-22 ENCOUNTER — Encounter: Payer: Self-pay | Admitting: Speech Pathology

## 2023-06-22 DIAGNOSIS — Z7689 Persons encountering health services in other specified circumstances: Secondary | ICD-10-CM | POA: Diagnosis not present

## 2023-06-22 DIAGNOSIS — R1312 Dysphagia, oropharyngeal phase: Secondary | ICD-10-CM

## 2023-06-22 DIAGNOSIS — R471 Dysarthria and anarthria: Secondary | ICD-10-CM | POA: Diagnosis not present

## 2023-06-22 NOTE — Therapy (Addendum)
 OUTPATIENT SPEECH LANGUAGE PATHOLOGY SWALLOW EVALUATION   Patient Name: Jeff [name] MRN: [id number] DOB:1960/08/08, 63 y.o., male Today's Date: 06/22/2023  PCP: Drue Second, MD REFERRING PROVIDER: Drue Second, MD  END OF SESSION:  End of Session - 06/22/23 1453     Visit Number 1    Number of Visits 10    Date for SLP Re-Evaluation 08/17/23    Authorization Type medicaid - request 10 visits    Authorization Time Period per referral notes, HH used 9 visits, however was discharging so possibly 10 HH visits used - I asked FO to check    SLP Start Time 1320    SLP Stop Time  1410    SLP Time Calculation (min) 50 min    Activity Tolerance Patient tolerated treatment well             History reviewed. No pertinent past medical history. Past Surgical History:  Procedure Laterality Date   NO PAST SURGERIES     ORIF ANKLE FRACTURE Left 01/24/2015   Procedure: OPEN REDUCTION INTERNAL FIXATION (ORIF) LEFT ANKLE FRACTURE;  Surgeon: Nadara Mustard, MD;  Location: [facility] OR;  Service: Orthopedics;  Laterality: Left;   Patient Active Problem List   Diagnosis Date Noted   Metabolic alkalosis 12/24/2022   Constipation 12/24/2022   Lactic acidosis 12/24/2022   Leak of percutaneous jejunostomy tube (HCC) 12/24/2022   Dehydration 12/23/2022   Iron deficiency anemia 11/13/2022   Cachexia (HCC) 11/12/2022   Severe protein-calorie malnutrition (HCC) 11/12/2022   Squamous cell carcinoma of tongue (HCC) 11/12/2022   Inadequate oral intake 11/11/2022   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 Last chemo 02/15/23. G tube placed 11/12/22.   1. Squamous cell carcinoma of base of tongue (CMD)  2. History of radiation therapy    PAIN:  Are you having pain? Yes: NPRS scale: 6/10 Pain location: left mouth and throat; gums Pain description: ache Aggravating factors: talking, chewing Relieving factors: gabapentin  FALLS: Has patient fallen in last 6 months?  No  LIVING ENVIRONMENT: Lives with: lives alone Lives in: House/apartment  PLOF:  Level of assistance: Independent with ADLs Employment: On disability  PATIENT GOALS: "To eat again"  OBJECTIVE:  Note: Objective measures were completed at Evaluation unless otherwise noted. OBJECTIVE:   DIAGNOSTIC FINDINGS:   INSTRUMENTAL SWALLOW STUDY FINDINGS (MBSS) 05/17/23  Objective swallow impairments: Severe oropharyngeal dysphagia characterized by consistent penetration during swallow, poor UES function and pharyngeal weakness leading to regurgitation into hypopharynx with spillover into laryngeal vestibule and trickle down aspiration. Copious amounts of secretions during evaluation- patient demonstrating moderate amount of difficulty/effort for expectorating/clearing. Intact sensorium, spontaneous, reflexive throat clear and cough response to penetration/aspiration events, effective with ejection of penetrated material. Intact labial seal with no anterior spillage. Patient edentulous and unable to masticate. Poor lingual manipulation and A-P transit. Poor oral containment with premature spillage to the pyriform sinuses. Noted BOT atrophy with decreased amount of tissue, along with concerns for shortened epiglottis. Decreased laryngeal elevation, nearly absent  hyoid excursion. Patient able to initially compensate and complete closure of laryngeal vestibule during the swallow; patient with  decreased pharyngeal constriction and significant UES dysfunction leading to impeding bolus passage and retrograde movement into the hypopharynx, resulting in spillover into laryngeal vestibule and intermittent trickle down aspiration. Intact sensorium with immediate, spontaneous cough and throat clear response. Grossly effective for ejection of penetrated material but not all aspirated material. Chin tuck implemented with improved closure of laryngeal vestibule, but due to UES dysfunction and regurgitation into hypopharynx, continued trickle down penetration/aspiration occurred post-swallow. With pureed texture (applesauce), pt was unable to swallow initial bolus due to xerostomia, patient reported need to expectorate. SLP mixed thin barium liquid into applesauce/barium, with improved transport into pharynx, but mod-severe difficulty with swallow completion and passage through UES. Pt given multiple large bolus thin liquid washes after pureed textures, with intermittent penetration to variable depths, but no visualized aspiration.   Objective recommended compensations: RECOMMENDATIONS Diet: NPO Aspiration precautions:  sit upright during all PO (as close to 90 degrees as possible) only feed when awake/alert Clear  Medications: via alternate means  COGNITION: Overall cognitive status: Within functional limits for tasks assessed  SUBJECTIVE DYSPHAGIA REPORTS:  Date of onset: G tube placed 11/12/22 Reported symptoms: odynophagia, globus sensation, xerostomia, and thick secretions and phlegm  Current diet: thin liquids and PEG TF  Co-morbid voice changes: No  FACTORS WHICH MAY INCREASE RISK OF ADVERSE EVENT IN PRESENCE OF ASPIRATION:  General health: frail or deconditioned  Risk factors: weak cough and tube present (PEG)     ORAL MOTOR EXAMINATION: Overall status: Impaired:   Labial: Bilateral (ROM and Coordination) Lingual: Bilateral (ROM, Symmetry, Strength, and Coordination) Comments:    CLINICAL SWALLOW ASSESSMENT:   Dentition: edentulous Vocal quality at baseline: normal Patient directly observed with POs: Yes: dysphagia 1 (puree) and thin liquids  Feeding: able to feed self Liquids provided by: cup [facility] Swallow Protocol:  N/A see MBSS Oral phase signs and symptoms:  none with puree or thin liquids Pharyngeal phase signs and symptoms: suspected delayed swallow initiation, multiple swallows, wet vocal quality, immediate throat clear, immediate cough, delayed cough, complaints of residue, and complaints of globus                                                                                                                              TREATMENT DATE:   06/22/23: eval completed - Reviewed recommendations from MBSS and reviewed Masako, CTAR, Shaker from [facility] ST with occasional min A. Instructed Butch to bring in respiratory trainers and CTAR resistance to next session. Reviewed oral care recommendations - Butch is consistent with oral care 2-3x a day.   PATIENT EDUCATION: Education details: See Treatment, See Patient Instructions; swallow precautions, diet modifications, oral care, HEP for dysphagia; dysarthria strategies Person educated: Patient Education method: Explanation, Demonstration, Verbal cues, and Handouts Education comprehension: verbalized understanding, returned demonstration, verbal cues required, and needs further education   ASSESSMENT:  CLINICAL  IMPRESSION: Patient is a 63 y.o. male who was seen today for severe oropharyngeal dysphagia s/p chemo and XRT for SCC base of tongue. He reports thick saliva and phlegm affecting his ability to swallow. He states this is due to allergies and the pollen. Reviewed late effects of XRT. He is  taking water and some tea at home, but unable to eat puree due to phlegm and changes in taste. He is completing HEP for dysphagia at home per [facility] ST, including Respiratory muscle strength training.  MBSS recommended NPO - pt has  PEG continuous feeds at home. Intelligibility is reduced as observed difficulty being understood by dispatcher when he called for transportation - he required assistance from front office staff. I recommend skilled ST to maximize safety of swallow and to increase PO intake as well as intelligibility.  OBJECTIVE IMPAIRMENTS: include dysarthria and dysphagia. These impairments are limiting patient from effectively communicating at home and in community and safety when swallowing. Factors affecting potential to achieve goals and functional outcome are medical prognosis. Patient will benefit from skilled SLP services to address above impairments and improve overall function.  REHAB POTENTIAL: Fair due to h/o XRT, SCC tongue   GOALS: Goals reviewed with patient? Yes  SHORT TERM GOALS: Target date: 07/20/23  Pt will complete HEP for dysphagia with rare min A Baseline: occasional min A Goal status: INITIAL  2.  Pt will follow swallow precautions on trials of Dysphagia 1 and thin liquids with rare min A Baseline: did not follow precautions Goal status: INITIAL  3.  Pt will follow diet and oral care recommendations with rare min A Baseline: occasional min A Goal status: INITIAL  4.  Pt will carryover compensatory strategies for dysarthria including self advocating to successfully give his name, DOB and address to transportation dispatcher Baseline: needed A to be understood  Goal status: INITIAL    LONG TERM GOALS: Target date: 08/17/23  Pt will complete HEP for dysphagia with mod I Baseline:  Goal status: occasional min A  2.  Pt will follow swallow precautions with mod I Baseline:  Goal status: did not follow precuations  3.  Pt will tolerate least restrictive diet following repeat swallow study Baseline:  Goal status: NPO  4.  Pt will use compensatory strategies to support intelligibility with health care providers Baseline:  Goal status: not understood by  transportation   PLAN:  SLP FREQUENCY: 2x/week  SLP DURATION: 8 weeks  PLANNED INTERVENTIONS: Aspiration precaution training, Pharyngeal strengthening exercises, Diet toleration management , Environmental controls, Trials of upgraded texture/liquids, Cueing hierachy, Internal/external aids, Functional tasks, Multimodal communication approach, and SLP instruction and feedback, MBSS    Deah Ottaway, Radene Journey, CCC-SLP 06/22/2023, 2:57 PM

## 2023-06-22 NOTE — Patient Instructions (Addendum)
   Keep up your swallowing exercises  Bring in the paper of your swallowing exercises  Bring in your blower devices and the chin exercise device  Great job drinking water throughout the day  Good job keeping your mouth clean!  You can try biotene moisturizer spray to see if it thins out your saliva  You can have a late effect of radiation even though you are done with radiation - it can still continue to affect your swallowing, saliva

## 2023-06-25 DIAGNOSIS — Z419 Encounter for procedure for purposes other than remedying health state, unspecified: Secondary | ICD-10-CM | POA: Diagnosis not present

## 2023-06-29 ENCOUNTER — Ambulatory Visit: Payer: Self-pay | Admitting: Speech Pathology

## 2023-06-29 ENCOUNTER — Encounter: Payer: Self-pay | Admitting: Speech Pathology

## 2023-06-29 DIAGNOSIS — R1312 Dysphagia, oropharyngeal phase: Secondary | ICD-10-CM

## 2023-06-29 DIAGNOSIS — R471 Dysarthria and anarthria: Secondary | ICD-10-CM | POA: Diagnosis not present

## 2023-06-29 NOTE — Therapy (Signed)
 OUTPATIENT SPEECH LANGUAGE PATHOLOGY SWALLOW THERAPY   Patient Name: Jeff [name] MRN: [id number] DOB:[birth year], 63 y.o., male Today's Date: 06/29/2023  PCP: Alethia Huxley, MD REFERRING PROVIDER: Alethia Huxley, MD  END OF SESSION:  End of Session - 06/29/23 1228     Visit Number 2    Number of Visits 10    Date for SLP Re-Evaluation 08/17/23    Authorization Type medicaid - request 10 visits    Authorization Time Period per referral notes, HH used 9 visits, however was discharging so possibly 10 HH visits used - I asked FO to check    SLP Start Time 1230    SLP Stop Time  1300    SLP Time Calculation (min) 30 min    Activity Tolerance Patient tolerated treatment well             History reviewed. No pertinent past medical history. Past Surgical History:  Procedure Laterality Date   NO PAST SURGERIES     ORIF ANKLE FRACTURE Left 01/24/2015   Procedure: OPEN REDUCTION INTERNAL FIXATION (ORIF) LEFT ANKLE FRACTURE;  Surgeon: Timothy Ford, MD;  Location: [facility] OR;  Service: Orthopedics;  Laterality: Left;   Patient Active Problem List   Diagnosis Date Noted   Metabolic alkalosis 12/24/2022   Constipation 12/24/2022   Lactic acidosis 12/24/2022   Leak of percutaneous jejunostomy tube (HCC) 12/24/2022   Dehydration 12/23/2022   Iron deficiency anemia 11/13/2022   Cachexia (HCC) 11/12/2022   Severe protein-calorie malnutrition (HCC) 11/12/2022   Squamous cell carcinoma of tongue (HCC) 11/12/2022   Inadequate oral intake 11/11/2022   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 Last chemo 02/15/23. G tube placed 11/12/22.   1. Squamous cell carcinoma of base of tongue (CMD)  2. History of radiation therapy    PAIN:  Are you having pain? Yes: NPRS scale: 6/10 Pain location: left mouth and throat; gums Pain description: ache Aggravating factors: talking, chewing Relieving factors: gabapentin  FALLS: Has patient fallen in last 6 months?  No  LIVING ENVIRONMENT: Lives with: lives alone Lives in: House/apartment  PLOF:  Level of assistance: Independent with ADLs Employment: On disability  PATIENT GOALS: "To eat again"  OBJECTIVE:  Note: Objective measures were completed at Evaluation unless otherwise noted. OBJECTIVE:   DIAGNOSTIC FINDINGS:   INSTRUMENTAL SWALLOW STUDY FINDINGS (MBSS) 05/17/23  Objective swallow impairments: Severe oropharyngeal dysphagia characterized by consistent penetration during swallow, poor UES function and pharyngeal weakness leading to regurgitation into hypopharynx with spillover into laryngeal vestibule and trickle down aspiration. Copious amounts of secretions during evaluation- patient demonstrating moderate amount of difficulty/effort for expectorating/clearing. Intact sensorium, spontaneous, reflexive throat clear and cough response to penetration/aspiration events, effective with ejection of penetrated material. Intact labial seal with no anterior spillage. Patient edentulous and unable to masticate. Poor lingual manipulation and A-P transit. Poor oral containment with premature spillage to the pyriform sinuses. Noted BOT atrophy with decreased amount of tissue, along with concerns for shortened epiglottis. Decreased laryngeal elevation, nearly absent hyoid excursion. Patient able to initially compensate and  complete closure of laryngeal vestibule during the swallow; patient with decreased pharyngeal constriction and  significant UES dysfunction leading to impeding bolus passage and retrograde movement into the hypopharynx, resulting in spillover into laryngeal vestibule and intermittent trickle down aspiration. Intact sensorium with immediate, spontaneous cough and throat clear response. Grossly effective for ejection of penetrated material but not all aspirated material. Chin tuck implemented with improved closure of laryngeal vestibule, but due to UES dysfunction and regurgitation into hypopharynx, continued trickle down penetration/aspiration occurred post-swallow. With pureed texture (applesauce), pt was unable to swallow initial bolus due to xerostomia, patient reported need to expectorate. SLP mixed thin barium liquid into applesauce/barium, with improved transport into pharynx, but mod-severe difficulty with swallow completion and passage through UES. Pt given multiple large bolus thin liquid washes after pureed textures, with intermittent penetration to variable depths, but no visualized aspiration.   Objective recommended compensations: RECOMMENDATIONS Diet: NPO Aspiration precautions:  sit upright during all PO (as close to 90 degrees as possible) only feed when awake/alert Clear  Medications: via alternate means  COGNITION: Overall cognitive status: Within functional limits for tasks assessed  SUBJECTIVE DYSPHAGIA REPORTS:  Date of onset: G tube placed 11/12/22 Reported symptoms: odynophagia, globus sensation, xerostomia, and thick secretions and phlegm  Current diet: thin liquids and PEG TF  Co-morbid voice changes: No  FACTORS WHICH MAY INCREASE RISK OF ADVERSE EVENT IN PRESENCE OF ASPIRATION:  General health: frail or deconditioned  Risk factors: weak cough and tube present (PEG)     ORAL MOTOR EXAMINATION: Overall status: Impaired:   Labial: Bilateral (ROM and Coordination) Lingual: Bilateral (ROM, Symmetry, Strength, and Coordination) Comments:   CLINICAL SWALLOW ASSESSMENT:    Dentition: edentulous Vocal quality at baseline: normal Patient directly observed with POs: Yes: dysphagia 1 (puree) and thin liquids  Feeding: able to feed self Liquids provided by: cup Yale Swallow Protocol:  N/A see MBSS Oral phase signs and symptoms:  none with puree or thin liquids Pharyngeal phase signs and symptoms: suspected delayed swallow initiation, multiple swallows, wet vocal quality, immediate throat clear, immediate cough, delayed cough, complaints of residue, and complaints of globus                                                                                                                              TREATMENT DATE:   06/29/23: Measure maximum expiratory pressure using manometer at 44 cm/H2O. Provided EMST 75 for expiratory muscle strength training  EMST- for dysphagia. Set device at about 20-25 cm/H2O - Butch demonstrated 5 sets of 5 reps on EMST with initial cues for labial seal and use of nose plug. Instructed him to complete 5 sets of 5 reps 3x a day. He brought in his neckline slimmer and demonstrated CTART with mod I. He demonstrated masako, effortful swallow and mendelson with rare min A.  PO trials of water and bite size Ritz cracker - Butch follows swallow precaution of multiple swallows and liquid wash. He  is drinking regularly at home, however PO intake is minimal. Prolonged oral phase to masticate cracker due to oral pain. He is taking crackers, oreos at home in the same manner. Encouraged some more frequent soft solids at home as tolerated.   06/22/23: eval completed - Reviewed recommendations from MBSS and reviewed Masako, CTAR, Shaker from [facility] ST with occasional min A. Instructed Butch to bring in respiratory trainers and CTAR resistance to next session. Reviewed oral care recommendations - Butch is consistent with oral care 2-3x a day.   PATIENT EDUCATION: Education details: See Treatment, See Patient Instructions; swallow precautions, diet modifications, oral  care, HEP for dysphagia; dysarthria strategies Person educated: Patient Education method: Explanation, Demonstration, Verbal cues, and Handouts Education comprehension: verbalized understanding, returned demonstration, verbal cues required, and needs further education   ASSESSMENT:  CLINICAL IMPRESSION: Patient is a 63 y.o. male who was seen today for severe oropharyngeal dysphagia s/p chemo and XRT for SCC base of tongue. He reports thick saliva and phlegm affecting his ability to swallow. He states this is due to allergies and the pollen. Reviewed late effects of XRT. He is  taking water and some tea at home, but unable to eat puree due to phlegm and changes in taste. He is completing HEP for dysphagia at home per [facility] ST, including Respiratory muscle strength training.  MBSS recommended NPO - pt has PEG continuous feeds at home. Intelligibility is reduced as observed difficulty being understood by dispatcher when he called for transportation - he required assistance from front office staff. I recommend skilled ST to maximize safety of swallow and to increase PO intake as well as intelligibility.  OBJECTIVE IMPAIRMENTS: include dysarthria and dysphagia. These impairments are limiting patient from effectively communicating at home and in community and safety when swallowing. Factors affecting potential to achieve goals and functional outcome are medical prognosis. Patient will benefit from skilled SLP services to address above impairments and improve overall function.  REHAB POTENTIAL: Fair due to h/o XRT, SCC tongue   GOALS: Goals reviewed with patient? Yes  SHORT TERM GOALS: Target date: 07/20/23  Pt will complete HEP for dysphagia with rare min A Baseline: occasional min A Goal status: ONGOING  2.  Pt will follow swallow precautions on trials of Dysphagia 1 and thin liquids with rare min A Baseline: did not follow precautions Goal status: ONGOING  3.  Pt will follow diet and oral care  recommendations with rare min A Baseline: occasional min A Goal status: ONGOING  4.  Pt will carryover compensatory strategies for dysarthria including self advocating to successfully give his name, DOB and address to transportation dispatcher Baseline: needed A to be understood  Goal status: ONGOING    LONG TERM GOALS: Target date: 08/17/23  Pt will complete HEP for dysphagia with mod I Baseline:  Goal status: occasional min A  2.  Pt will follow swallow precautions with mod I Baseline:  Goal status: did not follow precuations  3.  Pt will tolerate least restrictive diet following repeat swallow study Baseline:  Goal status: NPO  4.  Pt will use compensatory strategies to support intelligibility with health care providers Baseline:  Goal status: not understood by transportation   PLAN:  SLP FREQUENCY: 2x/week  SLP DURATION: 8 weeks  PLANNED INTERVENTIONS: Aspiration precaution training, Pharyngeal strengthening exercises, Diet toleration management , Environmental controls, Trials of upgraded texture/liquids, Cueing hierachy, Internal/external aids, Functional tasks, Multimodal communication approach, and SLP instruction and feedback, MBSS    Caprice Mccaffrey, Radene Journey,  CCC-SLP 06/29/2023, 1:18 PM

## 2023-06-29 NOTE — Patient Instructions (Addendum)
    Respiratory trainer device - EMST 75  Keep it at the set pressure  3 times a day - blow 5 sets of 5 for a total of 75 a day  SWALLOWING EXERCISES Effortful Swallows - Squeeze hard with the muscles in your neck while you swallow your  saliva or a sip of water - Repeat 20 times, 2-3 times a day, and use whenever you eat or drink  Masako Swallow - swallow with your tongue sticking out - Stick tongue out and gently bite tongue with your teeth - Swallow, while holding your tongue with your teeth - Repeat 20 times, 2-3 times a day   Shaker Exercise - head lift - Lie flat on your back in your bed or on a couch without pillows - Raise your head and look at your feet  - KEEP YOUR SHOULDERS DOWN - HOLD FOR 45 to 60 SECONDS, then lower your head back down - Repeat 3 times, 2-3 times a day   [employer] -  swallow as tight as you  for 5 seconds - Start to swallow, and keep your Adam's apple up by squeezing tight with the muscles of the throat - Hold the squeeze for 5-7 seconds and then relax - Repeat 20 times, 2-3 times a day   Tongue Press - Press your entire tongue as hard as you can against the roof of your mouth for 3-5 seconds - Repeat 20 times, 2-3 times a day        6. CTAR - Chin Tuck Against Resistance              - Place towel, ball or pool noodle under your chin             - Hold for 60 seconds 2-3x  a day             - Pulse up and down 20x 2-3x a day       7. Hold your breath, swallow and cough - do not breathe in before you cough              - 20x twice a day

## 2023-07-05 ENCOUNTER — Ambulatory Visit: Payer: Self-pay | Admitting: Speech Pathology

## 2023-07-05 NOTE — Therapy (Deleted)
 OUTPATIENT SPEECH LANGUAGE PATHOLOGY SWALLOW THERAPY   Patient Name: Jeff [name] MRN: [id number] DOB:03/16/1960, 63 y.o., male Today's Date: 07/05/2023  PCP: Alethia Huxley, MD REFERRING PROVIDER: Alethia Huxley, MD  END OF SESSION:    No past medical history on file. Past Surgical History:  Procedure Laterality Date   NO PAST SURGERIES     ORIF ANKLE FRACTURE Left 01/24/2015   Procedure: OPEN REDUCTION INTERNAL FIXATION (ORIF) LEFT ANKLE FRACTURE;  Surgeon: Timothy Ford, MD;  Location: [facility] OR;  Service: Orthopedics;  Laterality: Left;   Patient Active Problem List   Diagnosis Date Noted   Metabolic alkalosis 12/24/2022   Constipation 12/24/2022   Lactic acidosis 12/24/2022   Leak of percutaneous jejunostomy tube (HCC) 12/24/2022   Dehydration 12/23/2022   Iron deficiency anemia 11/13/2022   Cachexia (HCC) 11/12/2022   Severe protein-calorie malnutrition (HCC) 11/12/2022   Squamous cell carcinoma of tongue (HCC) 11/12/2022   Inadequate oral intake 11/11/2022   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  Rationale for Evaluation and Treatment: Rehabilitation  SUBJECTIVE:   SUBJECTIVE STATEMENT: *** Pt accompanied by: self  PERTINENT HISTORY: Squamous cell carcinoma of the base of tongue, stage IVA (cT3 cN2c), p16 negative, status post concurrent chemoradiotherapy. Last chemo 02/15/23. G tube placed 11/12/22.   1. Squamous cell carcinoma of base of tongue (CMD)  2. History of radiation therapy    PAIN:  Are you having pain? Yes: NPRS scale: 6/10 Pain location: left mouth and throat; gums Pain description: ache Aggravating factors: talking, chewing Relieving factors: gabapentin  FALLS: Has patient fallen in last 6 months?  No  LIVING ENVIRONMENT: Lives with:  lives alone Lives in: House/apartment  PLOF:  Level of assistance: Independent with ADLs Employment: On disability  PATIENT GOALS: "To eat again"  OBJECTIVE:  Note: Objective measures were completed at Evaluation unless otherwise noted. OBJECTIVE:   DIAGNOSTIC FINDINGS:   INSTRUMENTAL SWALLOW STUDY FINDINGS (MBSS) 05/17/23  Objective swallow impairments: Severe oropharyngeal dysphagia characterized by consistent penetration during swallow, poor UES function and pharyngeal weakness leading to regurgitation into hypopharynx with spillover into laryngeal vestibule and trickle down aspiration. Copious amounts of secretions during evaluation- patient demonstrating moderate amount of difficulty/effort for expectorating/clearing. Intact sensorium, spontaneous, reflexive throat clear and cough response to penetration/aspiration events, effective with ejection of penetrated material. Intact labial seal with no anterior spillage. Patient edentulous and unable to masticate. Poor lingual manipulation and A-P transit. Poor oral containment with premature spillage to the pyriform sinuses. Noted BOT atrophy with decreased amount of tissue, along with concerns for shortened epiglottis. Decreased laryngeal elevation, nearly absent hyoid excursion. Patient able to initially compensate and complete closure of laryngeal vestibule during the swallow; patient with decreased pharyngeal constriction and significant UES dysfunction leading to impeding bolus passage and retrograde movement into the hypopharynx, resulting in spillover into laryngeal vestibule and intermittent trickle down aspiration. Intact sensorium with immediate, spontaneous cough and throat clear response. Grossly effective for ejection of penetrated material but not all aspirated material. Chin tuck implemented with improved closure of laryngeal vestibule, but due to UES dysfunction and regurgitation into hypopharynx, continued trickle down  penetration/aspiration occurred post-swallow. With pureed texture (applesauce), pt was unable to swallow initial bolus due to xerostomia, patient reported need to expectorate. SLP mixed thin barium liquid into applesauce/barium, with improved  transport into pharynx, but mod-severe difficulty with swallow completion and passage through UES. Pt given multiple large bolus thin liquid washes after pureed textures, with intermittent penetration to variable depths, but no visualized aspiration.   Objective recommended compensations: RECOMMENDATIONS Diet: NPO Aspiration precautions:  sit upright during all PO (as close to 90 degrees as possible) only feed when awake/alert Clear  Medications: via alternate means  COGNITION: Overall cognitive status: Within functional limits for tasks assessed  SUBJECTIVE DYSPHAGIA REPORTS:  Date of onset: G tube placed 11/12/22 Reported symptoms: odynophagia, globus sensation, xerostomia, and thick secretions and phlegm  Current diet: thin liquids and PEG TF  Co-morbid voice changes: No  FACTORS WHICH MAY INCREASE RISK OF ADVERSE EVENT IN PRESENCE OF ASPIRATION:  General health: frail or deconditioned  Risk factors: weak cough and tube present (PEG)     ORAL MOTOR EXAMINATION: Overall status: Impaired:   Labial: Bilateral (ROM and Coordination) Lingual: Bilateral (ROM, Symmetry, Strength, and Coordination) Comments:   CLINICAL SWALLOW ASSESSMENT:   Dentition: edentulous Vocal quality at baseline: normal Patient directly observed with POs: Yes: dysphagia 1 (puree) and thin liquids  Feeding: able to feed self Liquids provided by: cup [facility] Swallow Protocol:  N/A see MBSS Oral phase signs and symptoms:  none with puree or thin liquids Pharyngeal phase signs and symptoms: suspected delayed swallow initiation, multiple swallows, wet vocal quality, immediate throat clear, immediate cough, delayed cough, complaints of residue, and complaints of globus                                                                                                                               TREATMENT DATE:   07/05/23:  06/29/23: Measure maximum expiratory pressure using manometer at 44 cm/H2O. Provided EMST 75 for expiratory muscle strength training  EMST- for dysphagia. Set device at about 20-25 cm/H2O - Butch demonstrated 5 sets of 5 reps on EMST with initial cues for labial seal and use of nose plug. Instructed him to complete 5 sets of 5 reps 3x a day. He brought in his neckline slimmer and demonstrated CTART with mod I. He demonstrated masako, effortful swallow and mendelson with rare min A.  PO trials of water and bite size Ritz cracker - Butch follows swallow precaution of multiple swallows and liquid wash. He is drinking regularly at home, however PO intake is minimal. Prolonged oral phase to masticate cracker due to oral pain. He is taking crackers, oreos at home in the same manner. Encouraged some more frequent soft solids at home as tolerated.   06/22/23: eval completed - Reviewed recommendations from MBSS and reviewed Masako, CTAR, Shaker from [facility] ST with occasional min A. Instructed Butch to bring in respiratory trainers and CTAR resistance to next session. Reviewed oral care recommendations - Butch is consistent with oral care 2-3x a day.   PATIENT EDUCATION: Education details: See Treatment, See Patient Instructions; swallow precautions, diet modifications, oral care, HEP for dysphagia; dysarthria strategies  Person educated: Patient Education method: Explanation, Demonstration, Verbal cues, and Handouts Education comprehension: verbalized understanding, returned demonstration, verbal cues required, and needs further education   ASSESSMENT:  CLINICAL IMPRESSION: Patient is a 63 y.o. male who was seen today for severe oropharyngeal dysphagia s/p chemo and XRT for SCC base of tongue. He reports thick saliva and phlegm affecting his ability to swallow. He states  this is due to allergies and the pollen. Reviewed late effects of XRT. He is  taking water and some tea at home, but unable to eat puree due to phlegm and changes in taste. He is completing HEP for dysphagia at home per [facility] ST, including Respiratory muscle strength training.  MBSS recommended NPO - pt has PEG continuous feeds at home. Intelligibility is reduced as observed difficulty being understood by dispatcher when he called for transportation - he required assistance from front office staff. I recommend skilled ST to maximize safety of swallow and to increase PO intake as well as intelligibility.  OBJECTIVE IMPAIRMENTS: include dysarthria and dysphagia. These impairments are limiting patient from effectively communicating at home and in community and safety when swallowing. Factors affecting potential to achieve goals and functional outcome are medical prognosis. Patient will benefit from skilled SLP services to address above impairments and improve overall function.  REHAB POTENTIAL: Fair due to h/o XRT, SCC tongue   GOALS: Goals reviewed with patient? Yes  SHORT TERM GOALS: Target date: 07/20/23  Pt will complete HEP for dysphagia with rare min A Baseline: occasional min A Goal status: ONGOING  2.  Pt will follow swallow precautions on trials of Dysphagia 1 and thin liquids with rare min A Baseline: did not follow precautions Goal status: ONGOING  3.  Pt will follow diet and oral care recommendations with rare min A Baseline: occasional min A Goal status: ONGOING  4.  Pt will carryover compensatory strategies for dysarthria including self advocating to successfully give his name, DOB and address to transportation dispatcher Baseline: needed A to be understood  Goal status: ONGOING    LONG TERM GOALS: Target date: 08/17/23  Pt will complete HEP for dysphagia with mod I Baseline:  Goal status: occasional min A  2.  Pt will follow swallow precautions with mod I Baseline:  Goal  status: did not follow precuations  3.  Pt will tolerate least restrictive diet following repeat swallow study Baseline:  Goal status: NPO  4.  Pt will use compensatory strategies to support intelligibility with health care providers Baseline:  Goal status: not understood by transportation   PLAN:  SLP FREQUENCY: 2x/week  SLP DURATION: 8 weeks  PLANNED INTERVENTIONS: Aspiration precaution training, Pharyngeal strengthening exercises, Diet toleration management , Environmental controls, Trials of upgraded texture/liquids, Cueing hierachy, Internal/external aids, Functional tasks, Multimodal communication approach, and SLP instruction and feedback, MBSS    [employer], Student-SLP 07/05/2023, 7:41 AM

## 2023-07-08 ENCOUNTER — Ambulatory Visit: Payer: Self-pay | Admitting: Speech Pathology

## 2023-07-08 DIAGNOSIS — R1312 Dysphagia, oropharyngeal phase: Secondary | ICD-10-CM | POA: Diagnosis not present

## 2023-07-08 DIAGNOSIS — R471 Dysarthria and anarthria: Secondary | ICD-10-CM | POA: Diagnosis not present

## 2023-07-08 NOTE — Patient Instructions (Signed)
 SWALLOWING EXERCISES Effortful Swallows - Squeeze hard with the muscles in your neck while you swallow your  saliva or a sip of water - Repeat 20 times, 2-3 times a day, and use whenever you eat or drink  Masako Swallow - swallow with your tongue sticking out - Stick tongue out and gently bite tongue with your teeth - Swallow, while holding your tongue with your teeth - Repeat 20 times, 2-3 times a day       3. CTAR - Chin Tuck Against Resistance              - Place towel, ball or pool noodle under your chin             - Hold for 60 seconds 2-3x  a day             - Pulse up and down 20x 2-3x a day

## 2023-07-08 NOTE — Therapy (Signed)
 OUTPATIENT SPEECH LANGUAGE PATHOLOGY SWALLOW THERAPY   Patient Name: Jeff [name] MRN: [id number] DOB:[birth year], 63 y.o., male Today's Date: 07/08/2023  PCP: Alethia Huxley, MD REFERRING PROVIDER: Alethia Huxley, MD  END OF SESSION:  End of Session - 07/08/23 1059     Visit Number 3    Number of Visits 10    Date for SLP Re-Evaluation 08/17/23    Authorization Type medicaid - request 10 visits    Authorization Time Period per referral notes, HH used 9 visits, however was discharging so possibly 10 HH visits used - I asked FO to check    SLP Start Time 1100    SLP Stop Time  1145    SLP Time Calculation (min) 45 min    Activity Tolerance Patient tolerated treatment well              No past medical history on file. Past Surgical History:  Procedure Laterality Date   NO PAST SURGERIES     ORIF ANKLE FRACTURE Left 01/24/2015   Procedure: OPEN REDUCTION INTERNAL FIXATION (ORIF) LEFT ANKLE FRACTURE;  Surgeon: Timothy Ford, MD;  Location: [facility] OR;  Service: Orthopedics;  Laterality: Left;   Patient Active Problem List   Diagnosis Date Noted   Metabolic alkalosis 12/24/2022   Constipation 12/24/2022   Lactic acidosis 12/24/2022   Leak of percutaneous jejunostomy tube (HCC) 12/24/2022   Dehydration 12/23/2022   Iron deficiency anemia 11/13/2022   Cachexia (HCC) 11/12/2022   Severe protein-calorie malnutrition (HCC) 11/12/2022   Squamous cell carcinoma of tongue (HCC) 11/12/2022   Inadequate oral intake 11/11/2022   Squamous cell carcinoma of base of tongue (HCC) 11/10/2022   Dysphagia 09/28/2022   Smoker 09/28/2022    ONSET DATE: 05/26/2023 referral date  REFERRING DIAG: R13.10 (ICD-10-CM) - Dysphagia, unspecified  THERAPY DIAG:  Dysphagia, oropharyngeal phase  Rationale for Evaluation and Treatment: Rehabilitation  SUBJECTIVE:   SUBJECTIVE STATEMENT: "Listen, I do them everyday I don't have nothing else to do" - pertaining to swallow exercises Pt accompanied  by: self  PERTINENT HISTORY: Squamous cell carcinoma of the base of tongue, stage IVA (cT3 cN2c), p16 negative, status post concurrent chemoradiotherapy. Last chemo 02/15/23. G tube placed 11/12/22.   1. Squamous cell carcinoma of base of tongue (CMD)  2. History of radiation therapy    PAIN:  Are you having pain? Yes: NPRS scale: 6/10 Pain location: left mouth and throat; gums Pain description: ache Aggravating factors: talking, chewing Relieving factors: gabapentin  FALLS: Has patient fallen in last 6 months?  No  LIVING ENVIRONMENT: Lives with: lives alone Lives in: House/apartment  PLOF:  Level of assistance: Independent with ADLs Employment: On disability  PATIENT GOALS: "To eat again"  OBJECTIVE:  Note: Objective measures were completed at Evaluation unless otherwise noted. OBJECTIVE:   DIAGNOSTIC FINDINGS:   INSTRUMENTAL SWALLOW STUDY FINDINGS (MBSS) 05/17/23  Objective swallow impairments: Severe oropharyngeal dysphagia characterized by consistent penetration during swallow, poor UES function and pharyngeal weakness leading to regurgitation into hypopharynx with spillover into laryngeal vestibule and trickle down aspiration. Copious amounts of secretions during evaluation- patient demonstrating moderate amount of difficulty/effort for expectorating/clearing. Intact sensorium, spontaneous, reflexive throat clear and cough response to penetration/aspiration events, effective with ejection of penetrated material. Intact labial seal with no anterior spillage. Patient edentulous and unable to masticate. Poor lingual manipulation and A-P transit. Poor oral containment with premature spillage to the pyriform sinuses. Noted BOT atrophy with decreased amount of tissue, along with concerns for shortened epiglottis. Decreased  laryngeal elevation, nearly absent hyoid excursion. Patient able to initially compensate and complete closure of laryngeal vestibule during the swallow; patient  with decreased pharyngeal constriction and significant UES dysfunction leading to impeding bolus passage and retrograde movement into the hypopharynx, resulting in spillover into laryngeal vestibule and intermittent trickle down aspiration. Intact sensorium with immediate, spontaneous cough and throat clear response. Grossly effective for ejection of penetrated material but not all aspirated material. Chin tuck implemented with improved closure of laryngeal vestibule, but due to UES dysfunction and regurgitation into hypopharynx, continued trickle down penetration/aspiration occurred post-swallow. With pureed texture (applesauce), pt was unable to swallow initial bolus due to xerostomia, patient reported need to expectorate. SLP mixed thin barium liquid into applesauce/barium, with improved transport into pharynx, but mod-severe difficulty with swallow completion and passage through UES. Pt given multiple large bolus thin liquid washes after pureed textures, with intermittent penetration to variable depths, but no visualized aspiration.   Objective recommended compensations: RECOMMENDATIONS Diet: NPO Aspiration precautions:  sit upright during all PO (as close to 90 degrees as possible) only feed when awake/alert Clear  Medications: via alternate means  COGNITION: Overall cognitive status: Within functional limits for tasks assessed  SUBJECTIVE DYSPHAGIA REPORTS:  Date of onset: G tube placed 11/12/22 Reported symptoms: odynophagia, globus sensation, xerostomia, and thick secretions and phlegm  Current diet: thin liquids and PEG TF  Co-morbid voice changes: No  FACTORS WHICH MAY INCREASE RISK OF ADVERSE EVENT IN PRESENCE OF ASPIRATION:  General health: frail or deconditioned  Risk factors: weak cough and tube present (PEG)     ORAL MOTOR EXAMINATION: Overall status: Impaired:   Labial: Bilateral (ROM and Coordination) Lingual: Bilateral (ROM, Symmetry, Strength, and Coordination) Comments:    CLINICAL SWALLOW ASSESSMENT:   Dentition: edentulous Vocal quality at baseline: normal Patient directly observed with POs: Yes: dysphagia 1 (puree) and thin liquids  Feeding: able to feed self Liquids provided by: cup [facility] Swallow Protocol:  N/A see MBSS Oral phase signs and symptoms:  none with puree or thin liquids Pharyngeal phase signs and symptoms: suspected delayed swallow initiation, multiple swallows, wet vocal quality, immediate throat clear, immediate cough, delayed cough, complaints of residue, and complaints of globus                                                                                                                              TREATMENT DATE:  07/08/23: Pt shares daily HEP completion. SLP led pt through CTAR exercise, pt completing 4 sets of 10 reps with min-A and occasional cues. Pt completed 20x effortful swallows and 20x masako swallows. Pt reports limited understanding of mendelson technique. SLP provided ongoing instruction to facilitate understanding, with pt reporting "I don't understand" "I can't do it" requiring max-A to complete ~5 repetitions. Pt shares leaving his EMST at home, but reports he has been practicing consistently. SLP initiated education of dysarthria strategies to support improved intelligibility with patient endorsing understanding. Pt able to demonstrate  strategies in conversation with occasional cues and min-A. Hand out provided to support pt understanding and carryover.  06/29/23: Measure maximum expiratory pressure using manometer at 44 cm/H2O. Provided EMST 75 for expiratory muscle strength training  EMST- for dysphagia. Set device at about 20-25 cm/H2O - Butch demonstrated 5 sets of 5 reps on EMST with initial cues for labial seal and use of nose plug. Instructed him to complete 5 sets of 5 reps 3x a day. He brought in his neckline slimmer and demonstrated CTART with mod I. He demonstrated masako, effortful swallow and mendelson with rare  min A.  PO trials of water and bite size Ritz cracker - Butch follows swallow precaution of multiple swallows and liquid wash. He is drinking regularly at home, however PO intake is minimal. Prolonged oral phase to masticate cracker due to oral pain. He is taking crackers, oreos at home in the same manner. Encouraged some more frequent soft solids at home as tolerated.   06/22/23: eval completed - Reviewed recommendations from MBSS and reviewed Masako, CTAR, Shaker from [facility] ST with occasional min A. Instructed Butch to bring in respiratory trainers and CTAR resistance to next session. Reviewed oral care recommendations - Butch is consistent with oral care 2-3x a day.   PATIENT EDUCATION: Education details: See Treatment, See Patient Instructions; swallow precautions, diet modifications, oral care, HEP for dysphagia; dysarthria strategies Person educated: Patient Education method: Explanation, Demonstration, Verbal cues, and Handouts Education comprehension: verbalized understanding, returned demonstration, verbal cues required, and needs further education   ASSESSMENT:  CLINICAL IMPRESSION: Patient is a 63 y.o. male who was seen today for severe oropharyngeal dysphagia s/p chemo and XRT for SCC base of tongue. He reports thick saliva and phlegm affecting his ability to swallow. He states this is due to allergies and the pollen. Reviewed late effects of XRT. He is  taking water and some tea at home, but unable to eat puree due to phlegm and changes in taste. He is completing HEP for dysphagia at home per [facility] ST, including Respiratory muscle strength training.  MBSS recommended NPO - pt has PEG continuous feeds at home. SLP initiated dysarthria strategies this date to target reduced intelligibility with pt able to demonstrate in conversation. I recommend skilled ST to maximize safety of swallow and to increase PO intake as well as intelligibility.  OBJECTIVE IMPAIRMENTS: include dysarthria and dysphagia.  These impairments are limiting patient from effectively communicating at home and in community and safety when swallowing. Factors affecting potential to achieve goals and functional outcome are medical prognosis. Patient will benefit from skilled SLP services to address above impairments and improve overall function.  REHAB POTENTIAL: Fair due to h/o XRT, SCC tongue   GOALS: Goals reviewed with patient? Yes  SHORT TERM GOALS: Target date: 07/20/23  Pt will complete HEP for dysphagia with rare min A Baseline: occasional min A Goal status: ONGOING  2.  Pt will follow swallow precautions on trials of Dysphagia 1 and thin liquids with rare min A Baseline: did not follow precautions Goal status: ONGOING  3.  Pt will follow diet and oral care recommendations with rare min A Baseline: occasional min A Goal status: ONGOING  4.  Pt will carryover compensatory strategies for dysarthria including self advocating to successfully give his name, DOB and address to transportation dispatcher Baseline: needed A to be understood  Goal status: ONGOING    LONG TERM GOALS: Target date: 08/17/23  Pt will complete HEP for dysphagia with mod I Baseline:  Goal status: occasional min A  2.  Pt will follow swallow precautions with mod I Baseline:  Goal status: did not follow precuations  3.  Pt will tolerate least restrictive diet following repeat swallow study Baseline:  Goal status: NPO  4.  Pt will use compensatory strategies to support intelligibility with health care providers Baseline:  Goal status: not understood by transportation   PLAN:  SLP FREQUENCY: 2x/week  SLP DURATION: 8 weeks  PLANNED INTERVENTIONS: Aspiration precaution training, Pharyngeal strengthening exercises, Diet toleration management , Environmental controls, Trials of upgraded texture/liquids, Cueing hierachy, Internal/external aids, Functional tasks, Multimodal communication approach, and SLP instruction and  feedback, MBSS    Shenandoah Vandergriff, Student-SLP 07/08/2023, 11:00 AM

## 2023-07-13 ENCOUNTER — Encounter: Payer: Self-pay | Admitting: Speech Pathology

## 2023-07-13 ENCOUNTER — Ambulatory Visit: Payer: Self-pay | Admitting: Speech Pathology

## 2023-07-13 DIAGNOSIS — R1312 Dysphagia, oropharyngeal phase: Secondary | ICD-10-CM | POA: Diagnosis not present

## 2023-07-13 DIAGNOSIS — R471 Dysarthria and anarthria: Secondary | ICD-10-CM

## 2023-07-13 DIAGNOSIS — Z7689 Persons encountering health services in other specified circumstances: Secondary | ICD-10-CM | POA: Diagnosis not present

## 2023-07-13 NOTE — Patient Instructions (Addendum)
  Use the hard swallow when you eat and drink   Yogurt  Cottage cheese  Kefir  Pudding  Ensure  Protein Drinks  Scrambled eggs  Soft noodles  Mashed potatoes or sweet potatoes

## 2023-07-13 NOTE — Therapy (Signed)
 OUTPATIENT SPEECH LANGUAGE PATHOLOGY SWALLOW THERAPY   Patient Name: Jeff [name] MRN: [id number] DOB:August 19, 1960, 63 y.o., male Today's Date: 07/13/2023  PCP: Alethia Huxley, MD REFERRING PROVIDER: Alethia Huxley, MD  END OF SESSION:  End of Session - 07/13/23 1102     Visit Number 4    Number of Visits 10    Date for SLP Re-Evaluation 08/17/23    Authorization Type medicaid - request 10 visits    Authorization Time Period per referral notes, HH used 9 visits, however was discharging so possibly 10 HH visits used - I asked FO to check    SLP Start Time 1100    SLP Stop Time  1145    SLP Time Calculation (min) 45 min    Activity Tolerance Patient tolerated treatment well              History reviewed. No pertinent past medical history. Past Surgical History:  Procedure Laterality Date   NO PAST SURGERIES     ORIF ANKLE FRACTURE Left 01/24/2015   Procedure: OPEN REDUCTION INTERNAL FIXATION (ORIF) LEFT ANKLE FRACTURE;  Surgeon: Timothy Ford, MD;  Location: [facility] OR;  Service: Orthopedics;  Laterality: Left;   Patient Active Problem List   Diagnosis Date Noted   Metabolic alkalosis 12/24/2022   Constipation 12/24/2022   Lactic acidosis 12/24/2022   Leak of percutaneous jejunostomy tube (HCC) 12/24/2022   Dehydration 12/23/2022   Iron deficiency anemia 11/13/2022   Cachexia (HCC) 11/12/2022   Severe protein-calorie malnutrition (HCC) 11/12/2022   Squamous cell carcinoma of tongue (HCC) 11/12/2022   Inadequate oral intake 11/11/2022   Squamous cell carcinoma of base of tongue (HCC) 11/10/2022   Dysphagia 09/28/2022   Smoker 09/28/2022    ONSET DATE: 05/26/2023 referral date  REFERRING DIAG: R13.10 (ICD-10-CM) - Dysphagia, unspecified  THERAPY DIAG:  Dysphagia, oropharyngeal phase  Dysarthria and anarthria  Rationale for Evaluation and Treatment: Rehabilitation  SUBJECTIVE:   SUBJECTIVE STATEMENT:  "The lift came early and I didn't get to feed myself and I  didn't get my allergy medicine"  Pt accompanied by: self  PERTINENT HISTORY: Squamous cell carcinoma of the base of tongue, stage IVA (cT3 cN2c), p16 negative, status post concurrent chemoradiotherapy. Last chemo 02/15/23. G tube placed 11/12/22.   1. Squamous cell carcinoma of base of tongue (CMD)  2. History of radiation therapy    PAIN:  Are you having pain? Yes: NPRS scale: 6/10 Pain location: left mouth and throat; gums Pain description: ache Aggravating factors: talking, chewing Relieving factors: gabapentin  FALLS: Has patient fallen in last 6 months?  No  LIVING ENVIRONMENT: Lives with: lives alone Lives in: House/apartment  PLOF:  Level of assistance: Independent with ADLs Employment: On disability  PATIENT GOALS: "To eat again"  OBJECTIVE:  Note: Objective measures were completed at Evaluation unless otherwise noted. OBJECTIVE:   DIAGNOSTIC FINDINGS:   INSTRUMENTAL SWALLOW STUDY FINDINGS (MBSS) 05/17/23  Objective swallow impairments: Severe oropharyngeal dysphagia characterized by consistent penetration during swallow, poor UES function and pharyngeal weakness leading to regurgitation into hypopharynx with spillover into laryngeal vestibule and trickle down aspiration. Copious amounts of secretions during evaluation- patient demonstrating moderate amount of difficulty/effort for expectorating/clearing. Intact sensorium, spontaneous, reflexive throat clear and cough response to penetration/aspiration events, effective with ejection of penetrated material. Intact labial seal with no anterior spillage. Patient edentulous and unable to masticate. Poor lingual manipulation and A-P transit. Poor oral containment with premature spillage to the pyriform sinuses. Noted BOT atrophy with decreased amount of  tissue, along with concerns for shortened epiglottis. Decreased laryngeal elevation, nearly absent hyoid excursion. Patient able to initially compensate and complete closure of  laryngeal vestibule during the swallow; patient with decreased pharyngeal constriction and significant UES dysfunction leading to impeding bolus passage and retrograde movement into the hypopharynx, resulting in spillover into laryngeal vestibule and intermittent trickle down aspiration. Intact sensorium with immediate, spontaneous cough and throat clear response. Grossly effective for ejection of penetrated material but not all aspirated material. Chin tuck implemented with improved closure of laryngeal vestibule, but due to UES dysfunction and regurgitation into hypopharynx, continued trickle down penetration/aspiration occurred post-swallow. With pureed texture (applesauce), pt was unable to swallow initial bolus due to xerostomia, patient reported need to expectorate. SLP mixed thin barium liquid into applesauce/barium, with improved transport into pharynx, but mod-severe difficulty with swallow completion and passage through UES. Pt given multiple large bolus thin liquid washes after pureed textures, with intermittent penetration to variable depths, but no visualized aspiration.   Objective recommended compensations: RECOMMENDATIONS Diet: NPO Aspiration precautions:  sit upright during all PO (as close to 90 degrees as possible) only feed when awake/alert Clear  Medications: via alternate means  COGNITION: Overall cognitive status: Within functional limits for tasks assessed  SUBJECTIVE DYSPHAGIA REPORTS:  Date of onset: G tube placed 11/12/22 Reported symptoms: odynophagia, globus sensation, xerostomia, and thick secretions and phlegm  Current diet: thin liquids and PEG TF  Co-morbid voice changes: No  FACTORS WHICH MAY INCREASE RISK OF ADVERSE EVENT IN PRESENCE OF ASPIRATION:  General health: frail or deconditioned  Risk factors: weak cough and tube present (PEG)     ORAL MOTOR EXAMINATION: Overall status: Impaired:   Labial: Bilateral (ROM and Coordination) Lingual: Bilateral (ROM,  Symmetry, Strength, and Coordination) Comments:   CLINICAL SWALLOW ASSESSMENT:   Dentition: edentulous Vocal quality at baseline: normal Patient directly observed with POs: Yes: dysphagia 1 (puree) and thin liquids  Feeding: able to feed self Liquids provided by: cup [facility] Swallow Protocol:  N/A see MBSS Oral phase signs and symptoms:  none with puree or thin liquids Pharyngeal phase signs and symptoms: suspected delayed swallow initiation, multiple swallows, wet vocal quality, immediate throat clear, immediate cough, delayed cough, complaints of residue, and complaints of globus                                                                                                                              TREATMENT DATE:   07/13/23: Jeff [name] enters visibly and verbally frustrated as his ride arrived early and he was still in bed. He has not had a change to do tube feeds or take meds due to early ride. He forgot his EMST 150 device. Jeff [name] continues to complete HEP daily. He has increased PO intake with puree foods - He continues to state allergies are affecting his ability to swallow as well. Ongoing c/o significant pain in left throat and neck. Jeff [name] believes seasonal allergies affect his throat pain  and result in him having to expectorate phlegm frequently. Reviewed strategies to manage secretion including hard swallows, sips, however he states he can't swallow due to allergies. PO trials of puree, water and fruit punch. Successive thin liquid sips without difficulty, however delayed cough with expectoration after multiple swallows of puree. He reports he ate a whole pudding cup yesterday without difficulty. Generated soft foods he can try at home - see Patient Instructions. Provided a food log to track daily PO intake.  07/08/23: Pt shares daily HEP completion. SLP led pt through CTAR exercise, pt completing 4 sets of 10 reps with min-A and occasional cues. Pt completed 20x effortful swallows and 20x  masako swallows. Pt reports limited understanding of mendelson technique. SLP provided ongoing instruction to facilitate understanding, with pt reporting "I don't understand" "I can't do it" requiring max-A to complete ~5 repetitions. Pt shares leaving his EMST at home, but reports he has been practicing consistently. SLP initiated education of dysarthria strategies to support improved intelligibility with patient endorsing understanding. Pt able to demonstrate strategies in conversation with occasional cues and min-A. Hand out provided to support pt understanding and carryover.  06/29/23: Measure maximum expiratory pressure using manometer at 44 cm/H2O. Provided EMST 75 for expiratory muscle strength training  EMST- for dysphagia. Set device at about 20-25 cm/H2O - Jeff [name] demonstrated 5 sets of 5 reps on EMST with initial cues for labial seal and use of nose plug. Instructed him to complete 5 sets of 5 reps 3x a day. He brought in his neckline slimmer and demonstrated CTART with mod I. He demonstrated masako, effortful swallow and mendelson with rare min A.  PO trials of water and bite size Ritz cracker - Jeff [name] follows swallow precaution of multiple swallows and liquid wash. He is drinking regularly at home, however PO intake is minimal. Prolonged oral phase to masticate cracker due to oral pain. He is taking crackers, oreos at home in the same manner. Encouraged some more frequent soft solids at home as tolerated.   06/22/23: eval completed - Reviewed recommendations from MBSS and reviewed Masako, CTAR, Shaker from [facility] ST with occasional min A. Instructed Jeff [name] to bring in respiratory trainers and CTAR resistance to next session. Reviewed oral care recommendations - Jeff [name] is consistent with oral care 2-3x a day.   PATIENT EDUCATION: Education details: See Treatment, See Patient Instructions; swallow precautions, diet modifications, oral care, HEP for dysphagia; dysarthria strategies Person educated:  Patient Education method: Explanation, Demonstration, Verbal cues, and Handouts Education comprehension: verbalized understanding, returned demonstration, verbal cues required, and needs further education   ASSESSMENT:  CLINICAL IMPRESSION: Patient is a 63 y.o. male who was seen today for severe oropharyngeal dysphagia s/p chemo and XRT for SCC base of tongue. He reports thick saliva and phlegm affecting his ability to swallow. He states this is due to allergies and the pollen. Reviewed late effects of XRT. He is  taking water and some tea at home, but unable to eat puree due to phlegm and changes in taste. He is completing HEP for dysphagia at home per [facility] ST, including Respiratory muscle strength training.  MBSS recommended NPO - pt has PEG continuous feeds at home. SLP initiated dysarthria strategies this date to target reduced intelligibility with pt able to demonstrate in conversation. I recommend skilled ST to maximize safety of swallow and to increase PO intake as well as intelligibility.  OBJECTIVE IMPAIRMENTS: include dysarthria and dysphagia. These impairments are limiting patient from effectively communicating at home and in  community and safety when swallowing. Factors affecting potential to achieve goals and functional outcome are medical prognosis. Patient will benefit from skilled SLP services to address above impairments and improve overall function.  REHAB POTENTIAL: Fair due to h/o XRT, SCC tongue   GOALS: Goals reviewed with patient? Yes  SHORT TERM GOALS: Target date: 07/20/23  Pt will complete HEP for dysphagia with rare min A Baseline: occasional min A Goal status: ONGOING  2.  Pt will follow swallow precautions on trials of Dysphagia 1 and thin liquids with rare min A Baseline: did not follow precautions Goal status: ONGOING  3.  Pt will follow diet and oral care recommendations with rare min A Baseline: occasional min A Goal status: ONGOING  4.  Pt will carryover  compensatory strategies for dysarthria including self advocating to successfully give his name, DOB and address to transportation dispatcher Baseline: needed A to be understood  Goal status: ONGOING    LONG TERM GOALS: Target date: 08/17/23  Pt will complete HEP for dysphagia with mod I Baseline:  Goal status: occasional min A  2.  Pt will follow swallow precautions with mod I Baseline:  Goal status: did not follow precuations  3.  Pt will tolerate least restrictive diet following repeat swallow study Baseline:  Goal status: NPO  4.  Pt will use compensatory strategies to support intelligibility with health care providers Baseline:  Goal status: not understood by transportation   PLAN:  SLP FREQUENCY: 2x/week  SLP DURATION: 8 weeks  PLANNED INTERVENTIONS: Aspiration precaution training, Pharyngeal strengthening exercises, Diet toleration management , Environmental controls, Trials of upgraded texture/liquids, Cueing hierachy, Internal/external aids, Functional tasks, Multimodal communication approach, and SLP instruction and feedback, MBSS    Taym Twist, Dareen Ebbing, CCC-SLP 07/13/2023, 11:51 AM

## 2023-07-14 DIAGNOSIS — C01 Malignant neoplasm of base of tongue: Secondary | ICD-10-CM | POA: Diagnosis not present

## 2023-07-18 ENCOUNTER — Ambulatory Visit: Payer: Self-pay | Admitting: Speech Pathology

## 2023-07-25 DIAGNOSIS — Z419 Encounter for procedure for purposes other than remedying health state, unspecified: Secondary | ICD-10-CM | POA: Diagnosis not present

## 2023-07-26 ENCOUNTER — Ambulatory Visit: Payer: Self-pay | Attending: Oncology | Admitting: Speech Pathology

## 2023-07-26 DIAGNOSIS — R471 Dysarthria and anarthria: Secondary | ICD-10-CM | POA: Insufficient documentation

## 2023-07-26 DIAGNOSIS — R1312 Dysphagia, oropharyngeal phase: Secondary | ICD-10-CM | POA: Insufficient documentation

## 2023-07-26 DIAGNOSIS — Z7689 Persons encountering health services in other specified circumstances: Secondary | ICD-10-CM | POA: Diagnosis not present

## 2023-07-26 LAB — AMB RESULTS CONSOLE CBG: Glucose: 66

## 2023-07-26 NOTE — Progress Notes (Signed)
 Pt received CGB and BP checks and declined SDOH.

## 2023-07-26 NOTE — Therapy (Signed)
 OUTPATIENT SPEECH LANGUAGE PATHOLOGY SWALLOW THERAPY   Patient Name: DERMOTT SARTWELL MRN: [id number] DOB:[birth year], 63 y.o., male Today's Date: 07/26/2023  PCP: Alethia Huxley, MD REFERRING PROVIDER: Alethia Huxley, MD  END OF SESSION:  End of Session - 07/26/23 1051     Visit Number 5    Number of Visits 10    Date for SLP Re-Evaluation 08/17/23    Authorization Type medicaid - request 10 visits    Authorization Time Period per referral notes, HH used 9 visits, however was discharging so possibly 10 HH visits used - I asked FO to check    SLP Start Time 1051    SLP Stop Time  1136    SLP Time Calculation (min) 45 min    Activity Tolerance Patient tolerated treatment well              No past medical history on file. Past Surgical History:  Procedure Laterality Date   NO PAST SURGERIES     ORIF ANKLE FRACTURE Left 01/24/2015   Procedure: OPEN REDUCTION INTERNAL FIXATION (ORIF) LEFT ANKLE FRACTURE;  Surgeon: Timothy Ford, MD;  Location: [facility] OR;  Service: Orthopedics;  Laterality: Left;   Patient Active Problem List   Diagnosis Date Noted   Metabolic alkalosis 12/24/2022   Constipation 12/24/2022   Lactic acidosis 12/24/2022   Leak of percutaneous jejunostomy tube (HCC) 12/24/2022   Dehydration 12/23/2022   Iron deficiency anemia 11/13/2022   Cachexia (HCC) 11/12/2022   Severe protein-calorie malnutrition (HCC) 11/12/2022   Squamous cell carcinoma of tongue (HCC) 11/12/2022   Inadequate oral intake 11/11/2022   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 Reduced use of PEG feedings, has  been eating soft textures   PERTINENT HISTORY: Squamous cell carcinoma of the base of tongue, stage IVA (cT3 cN2c), p16 negative, status post concurrent chemoradiotherapy. Last chemo 02/15/23. G tube placed 11/12/22.   1. Squamous cell carcinoma of base of tongue (CMD)  2. History of radiation therapy    PAIN:  Are you having pain? Yes: NPRS scale: 6/10 Pain location: left mouth and throat; gums Pain description: ache Aggravating factors: talking, chewing Relieving factors: gabapentin  FALLS: Has patient fallen in last 6 months?  No  LIVING ENVIRONMENT: Lives with: lives alone Lives in: House/apartment  PLOF:  Level of assistance: Independent with ADLs Employment: On disability  PATIENT GOALS: "To eat again"  OBJECTIVE:  Note: Objective measures were completed at Evaluation unless otherwise noted. OBJECTIVE:   DIAGNOSTIC FINDINGS:   INSTRUMENTAL SWALLOW STUDY FINDINGS (MBSS) 05/17/23  Objective swallow impairments: Severe oropharyngeal dysphagia characterized by consistent penetration during swallow, poor UES function and pharyngeal weakness leading to regurgitation into hypopharynx with spillover into laryngeal vestibule and trickle down aspiration. Copious amounts of secretions during evaluation- patient demonstrating moderate amount of difficulty/effort for expectorating/clearing. Intact sensorium, spontaneous, reflexive throat clear and cough response to penetration/aspiration events, effective with ejection of penetrated material. Intact labial seal with no anterior spillage. Patient edentulous and unable to masticate. Poor lingual manipulation and A-P transit. Poor oral containment with premature spillage to the pyriform sinuses. Noted BOT atrophy with decreased amount of tissue, along with concerns for shortened  epiglottis. Decreased laryngeal elevation, nearly absent hyoid excursion. Patient able to initially compensate and complete closure of laryngeal vestibule during the  swallow; patient with decreased pharyngeal constriction and significant UES dysfunction leading to impeding bolus passage and retrograde movement into the hypopharynx, resulting in spillover into laryngeal vestibule and intermittent trickle down aspiration. Intact sensorium with immediate, spontaneous cough and throat clear response. Grossly effective for ejection of penetrated material but not all aspirated material. Chin tuck implemented with improved closure of laryngeal vestibule, but due to UES dysfunction and regurgitation into hypopharynx, continued trickle down penetration/aspiration occurred post-swallow. With pureed texture (applesauce), pt was unable to swallow initial bolus due to xerostomia, patient reported need to expectorate. SLP mixed thin barium liquid into applesauce/barium, with improved transport into pharynx, but mod-severe difficulty with swallow completion and passage through UES. Pt given multiple large bolus thin liquid washes after pureed textures, with intermittent penetration to variable depths, but no visualized aspiration.   Objective recommended compensations: RECOMMENDATIONS Diet: NPO Aspiration precautions:  sit upright during all PO (as close to 90 degrees as possible) only feed when awake/alert Clear  Medications: via alternate means  COGNITION: Overall cognitive status: Within functional limits for tasks assessed  SUBJECTIVE DYSPHAGIA REPORTS:  Date of onset: G tube placed 11/12/22 Reported symptoms: odynophagia, globus sensation, xerostomia, and thick secretions and phlegm  Current diet: thin liquids and PEG TF  Co-morbid voice changes: No  FACTORS WHICH MAY INCREASE RISK OF ADVERSE EVENT IN PRESENCE OF ASPIRATION:  General health: frail or deconditioned  Risk factors: weak cough and tube present (PEG)     ORAL MOTOR EXAMINATION: Overall status: Impaired:   Labial: Bilateral (ROM and Coordination) Lingual: Bilateral (ROM, Symmetry, Strength, and  Coordination) Comments:   CLINICAL SWALLOW ASSESSMENT:   Dentition: edentulous Vocal quality at baseline: normal Patient directly observed with POs: Yes: dysphagia 1 (puree) and thin liquids  Feeding: able to feed self Liquids provided by: cup [facility] Swallow Protocol: N/A see MBSS Oral phase signs and symptoms: none with puree or thin liquids Pharyngeal phase signs and symptoms: suspected delayed swallow initiation, multiple swallows, wet vocal quality, immediate throat clear, immediate cough, delayed cough, complaints of residue, and complaints of globus                                                                                                                              TREATMENT DATE:   07/26/23:  DYSPHAGIA TREATMENT:   Patient directly observed with POs: Yes: dysphagia 1 (puree) and thin liquids  Feeding: able to feed self Liquids provided by: cup Oral phase signs and symptoms: anterior loss/spillage Pharyngeal phase signs and symptoms: multiple swallows, immediate throat clear, and delayed cough with expectoration  Therapeutic exercises:  EMST: set to 20 cm H2O, pt completes 2 sets of 5 repetitions with good accuracy with mod-I evidenced by short, strong burst of air releasing pressure valve. Pt rates effort as 3/10. SLP increased setting to ~25 cm H2O  with pt continuing to demonstrate ability to perform correctly.  Effortful swallow with thin liquid bolus: SLP cues for small sips, bolus hold and hard/fast swallow. Pt completes x15 repetitions. With thin liquid boluses, pt eventually demonstrating delayed cough and expectoration. Pt believes expectoration to be mucus from PND. SLP provides education on slowing rate to allow full pharyngeal and esophageal clearance, requiring ongoing cues for appropriate pacing. With appropriate pacing, reduced coughing, burping and expectoration noted.  Treatment comments: SLP provided pt education regarding importance of oral care, pt reporting  use of brush and paste daily. Is using Sprite to rinse out mouth following PO trials. Reviewed swallow strategies: slow rate, small bites, liquid wash, moisten with sauce/gravy    DYSARTHRIA TREATMENT:  Reviewed previously instructed dysarthria strategies with focus on over-articulation. Targeted practice during automatic speech tasks with pt requiring usual model to increase accuracy of skill. In conversation, pt largely intelligible throughout today's conversation. 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 Pt with noted reduced lingual protrusion and elevation. Plan to instruct for active and passive lingual stretching.   07/13/23: Butch enters visibly and verbally frustrated as his ride arrived early and he was still in bed. He has not had a change to do tube feeds or take meds due to early ride. He forgot his EMST 150 device. Butch continues to complete HEP daily. He has increased PO intake with puree foods - He continues to state allergies are affecting his ability to swallow as well. Ongoing c/o significant pain in left throat and neck. Butch believes seasonal allergies affect his throat pain and result in him having to expectorate phlegm frequently. Reviewed strategies to manage secretion including hard swallows, sips, however he states he can't swallow due to allergies. PO trials of puree, water and fruit punch. Successive thin liquid sips without difficulty, however delayed cough with expectoration after multiple swallows of puree. He reports he ate a whole pudding cup yesterday without difficulty. Generated soft foods he can try at home - see Patient Instructions. Provided a food log to track daily PO intake.  07/08/23: Pt shares daily HEP completion. SLP led pt through CTAR exercise, pt completing 4 sets of 10 reps with min-A and occasional cues. Pt completed 20x effortful swallows and 20x masako swallows.  Pt reports limited understanding of mendelson technique. SLP provided ongoing instruction to facilitate understanding, with pt reporting "I don't understand" "I can't do it" requiring max-A to complete ~5 repetitions. Pt shares leaving his EMST at home, but reports he has been practicing consistently. SLP initiated education of dysarthria strategies to support improved intelligibility with patient endorsing understanding. Pt able to demonstrate strategies in conversation with occasional cues and min-A. Hand out provided to support pt understanding and carryover.  06/29/23: Measure maximum expiratory pressure using manometer at 44 cm/H2O. Provided EMST 75 for expiratory muscle strength training  EMST- for dysphagia. Set device at about 20-25 cm/H2O - Butch demonstrated 5 sets of 5 reps on EMST with initial cues for labial seal and use of nose plug. Instructed him to complete 5 sets of 5 reps 3x a day. He brought in his neckline slimmer and demonstrated CTART with mod I. He demonstrated masako, effortful swallow and mendelson with rare min A.  PO trials of water and bite size Ritz cracker - Butch follows swallow precaution of multiple swallows and liquid wash. He is drinking regularly at home, however PO intake is minimal.  Prolonged oral phase to masticate cracker due to oral pain. He is taking crackers, oreos at home in the same manner. Encouraged some more frequent soft solids at home as tolerated.   06/22/23: eval completed - Reviewed recommendations from MBSS and reviewed Masako, CTAR, Shaker from [facility] ST with occasional min A. Instructed Butch to bring in respiratory trainers and CTAR resistance to next session. Reviewed oral care recommendations - Butch is consistent with oral care 2-3x a day.   PATIENT EDUCATION: Education details: See Treatment, See Patient Instructions; swallow precautions, diet modifications, oral care, HEP for dysphagia; dysarthria strategies Person educated: Patient Education method:  Explanation, Demonstration, Verbal cues, and Handouts Education comprehension: verbalized understanding, returned demonstration, verbal cues required, and needs further education   ASSESSMENT:  CLINICAL IMPRESSION: Patient is a 63 y.o. male who was seen today for severe oropharyngeal dysphagia s/p chemo and XRT for SCC base of tongue. He reports thick saliva and phlegm affecting his ability to swallow. He states this is due to allergies and the pollen. Reviewed late effects of XRT. He is  taking water and some tea at home, but unable to eat puree due to phlegm and changes in taste. He is completing HEP for dysphagia at home per [facility], including Respiratory muscle strength training.  MBSS recommended NPO - pt has PEG continuous feeds at home. SLP initiated dysarthria strategies this date to target reduced intelligibility with pt able to demonstrate in conversation. I recommend skilled ST to maximize safety of swallow and to increase PO intake as well as intelligibility.  OBJECTIVE IMPAIRMENTS: include dysarthria and dysphagia. These impairments are limiting patient from effectively communicating at home and in community and safety when swallowing. Factors affecting potential to achieve goals and functional outcome are medical prognosis. Patient will benefit from skilled SLP services to address above impairments and improve overall function.  REHAB POTENTIAL: Fair due to h/o XRT, SCC tongue   GOALS: Goals reviewed with patient? Yes  SHORT TERM GOALS: Target date: 07/20/23  Pt will complete HEP for dysphagia with rare min A Baseline: occasional min A Goal status: ONGOING  2.  Pt will follow swallow precautions on trials of Dysphagia 1 and thin liquids with rare min A Baseline: did not follow precautions Goal status: ONGOING  3.  Pt will follow diet and oral care recommendations with rare min A Baseline: occasional min A Goal status: ONGOING  4.  Pt will carryover compensatory strategies  for dysarthria including self advocating to successfully give his name, DOB and address to transportation dispatcher Baseline: needed A to be understood  Goal status: ONGOING    LONG TERM GOALS: Target date: 08/17/23  Pt will complete HEP for dysphagia with mod I Baseline:  Goal status: occasional min A  2.  Pt will follow swallow precautions with mod I Baseline:  Goal status: did not follow precuations  3.  Pt will tolerate least restrictive diet following repeat swallow study Baseline:  Goal status: NPO  4.  Pt will use compensatory strategies to support intelligibility with health care providers Baseline:  Goal status: not understood by transportation   PLAN:  SLP FREQUENCY: 2x/week  SLP DURATION: 8 weeks  PLANNED INTERVENTIONS: Aspiration precaution training, Pharyngeal strengthening exercises, Diet toleration management , Environmental controls, Trials of upgraded texture/liquids, Cueing hierachy, Internal/external aids, Functional tasks, Multimodal communication approach, and SLP instruction and feedback, MBSS    Alston Jerry, CCC-SLP 07/26/2023, 10:51 AM

## 2023-07-28 ENCOUNTER — Ambulatory Visit: Payer: Self-pay | Admitting: Speech Pathology

## 2023-07-28 DIAGNOSIS — R471 Dysarthria and anarthria: Secondary | ICD-10-CM | POA: Diagnosis not present

## 2023-07-28 DIAGNOSIS — R1312 Dysphagia, oropharyngeal phase: Secondary | ICD-10-CM

## 2023-07-28 NOTE — Therapy (Addendum)
 OUTPATIENT SPEECH LANGUAGE PATHOLOGY SWALLOW THERAPY   Patient Name: Jeff [name] MRN: [id number] DOB:12/25/1960, 63 y.o., male Today's Date: 07/28/2023  PCP: Alethia Huxley, MD REFERRING PROVIDER: Alethia Huxley, MD  END OF SESSION:  End of Session - 07/28/23 1419     Visit Number 6    Number of Visits 10    Date for SLP Re-Evaluation 08/17/23    Authorization Type medicaid - request 10 visits    Authorization Time Period per referral notes, HH used 9 visits, however was discharging so possibly 10 HH visits used - I asked FO to check    SLP Start Time 1419    SLP Stop Time  1504    SLP Time Calculation (min) 45 min    Activity Tolerance Patient tolerated treatment well              No past medical history on file. Past Surgical History:  Procedure Laterality Date   NO PAST SURGERIES     ORIF ANKLE FRACTURE Left 01/24/2015   Procedure: OPEN REDUCTION INTERNAL FIXATION (ORIF) LEFT ANKLE FRACTURE;  Surgeon: Timothy Ford, MD;  Location: [facility] OR;  Service: Orthopedics;  Laterality: Left;   Patient Active Problem List   Diagnosis Date Noted   Metabolic alkalosis 12/24/2022   Constipation 12/24/2022   Lactic acidosis 12/24/2022   Leak of percutaneous jejunostomy tube (HCC) 12/24/2022   Dehydration 12/23/2022   Iron deficiency anemia 11/13/2022   Cachexia (HCC) 11/12/2022   Severe protein-calorie malnutrition (HCC) 11/12/2022   Squamous cell carcinoma of tongue (HCC) 11/12/2022   Inadequate oral intake 11/11/2022   Squamous cell carcinoma of base of tongue (HCC) 11/10/2022   Dysphagia 09/28/2022   Smoker 09/28/2022    ONSET DATE: 05/26/2023 referral date  REFERRING DIAG: R13.10 (ICD-10-CM) - Dysphagia, unspecified  THERAPY DIAG:  Dysphagia, oropharyngeal phase  Dysarthria and anarthria  Rationale for Evaluation and Treatment: Rehabilitation  SUBJECTIVE:   SUBJECTIVE STATEMENT: Pt ate chicken and dumplings for dinner last night without overt issue.    PERTINENT HISTORY: Squamous cell carcinoma of the base of tongue, stage IVA (cT3 cN2c), p16 negative, status post concurrent chemoradiotherapy. Last chemo 02/15/23. G tube placed 11/12/22.   1. Squamous cell carcinoma of base of tongue (CMD)  2. History of radiation therapy    PAIN:  Are you having pain? Yes: NPRS scale: 6/10 Pain location: left mouth and throat Pain description: ache Aggravating factors: talking, chewing Relieving factors: gabapentin, magic mouthwash, warm tea   FALLS: Has patient fallen in last 6 months?  No  LIVING ENVIRONMENT: Lives with: lives alone Lives in: House/apartment  PLOF:  Level of assistance: Independent with ADLs Employment: On disability  PATIENT GOALS: "To eat again"  OBJECTIVE:  Note: Objective measures were completed at Evaluation unless otherwise noted. OBJECTIVE:   DIAGNOSTIC FINDINGS:   INSTRUMENTAL SWALLOW STUDY FINDINGS (MBSS) 05/17/23  Objective swallow impairments: Severe oropharyngeal dysphagia characterized by consistent penetration during swallow, poor UES function and pharyngeal weakness leading to regurgitation into hypopharynx with spillover into laryngeal vestibule and trickle down aspiration. Copious amounts of secretions during evaluation- patient demonstrating moderate amount of difficulty/effort for expectorating/clearing. Intact sensorium, spontaneous, reflexive throat clear and cough response to penetration/aspiration events, effective with ejection of penetrated material. Intact labial seal with no anterior spillage. Patient edentulous and unable to masticate. Poor lingual manipulation and A-P transit. Poor oral containment with premature spillage to the pyriform sinuses. Noted BOT atrophy with decreased amount of tissue, along with concerns for shortened epiglottis. Decreased  laryngeal elevation, nearly absent hyoid excursion. Patient able to initially compensate and complete closure of laryngeal vestibule during the  swallow; patient with decreased pharyngeal constriction and significant UES dysfunction leading to impeding bolus passage and retrograde movement into the hypopharynx, resulting in spillover into laryngeal vestibule and intermittent trickle down aspiration. Intact sensorium with immediate, spontaneous cough and throat clear response. Grossly effective for ejection of penetrated material but not all aspirated material. Chin tuck implemented with improved closure of laryngeal vestibule, but due to UES dysfunction and regurgitation into hypopharynx, continued trickle down penetration/aspiration occurred post-swallow. With pureed texture (applesauce), pt was unable to swallow initial bolus due to xerostomia, patient reported need to expectorate. SLP mixed thin barium liquid into applesauce/barium, with improved transport into pharynx, but mod-severe difficulty with swallow completion and passage through UES. Pt given multiple large bolus thin liquid washes after pureed textures, with intermittent penetration to variable depths, but no visualized aspiration.   Objective recommended compensations: RECOMMENDATIONS Diet: NPO Aspiration precautions:  sit upright during all PO (as close to 90 degrees as possible) only feed when awake/alert Clear  Medications: via alternate means  COGNITION: Overall cognitive status: Within functional limits for tasks assessed  SUBJECTIVE DYSPHAGIA REPORTS:  Date of onset: G tube placed 11/12/22 Reported symptoms: odynophagia, globus sensation, xerostomia, and thick secretions and phlegm  Current diet: thin liquids and PEG TF  Co-morbid voice changes: No  FACTORS WHICH MAY INCREASE RISK OF ADVERSE EVENT IN PRESENCE OF ASPIRATION:  General health: frail or deconditioned  Risk factors: weak cough and tube present (PEG)     ORAL MOTOR EXAMINATION: Overall status: Impaired:   Labial: Bilateral (ROM and Coordination) Lingual: Bilateral (ROM, Symmetry, Strength, and  Coordination) Comments:   CLINICAL SWALLOW ASSESSMENT:   Dentition: edentulous Vocal quality at baseline: normal Patient directly observed with POs: Yes: dysphagia 1 (puree) and thin liquids  Feeding: able to feed self Liquids provided by: cup [facility] Swallow Protocol: N/A see MBSS Oral phase signs and symptoms: none with puree or thin liquids Pharyngeal phase signs and symptoms: suspected delayed swallow initiation, multiple swallows, wet vocal quality, immediate throat clear, immediate cough, delayed cough, complaints of residue, and complaints of globus                                                                                                                         TREATMENT DATE:  07/28/23:  DYSPHAGIA TREATMENT:   Patient directly observed with POs: Yes: dysphagia 1 (puree) and thin liquids  Feeding: able to feed self Liquids provided by: cup Oral phase signs and symptoms: anterior loss/spillage Pharyngeal phase signs and symptoms: multiple swallows, immediate throat clear, and delayed cough with expectoration  Therapeutic exercises:  SLP introduced lingual stretching exercises- active and assisted. Pt directed to complete anterior and anterolateral extensions with 5 second hold. Pt noting sensation of stretch at base of tongue throughout. Pt completes 5 reps with mod-I. Pt then directed to complete same stretches with use of assist to  enhance ROM.  EMST: did not address this date Effortful swallow with puree bolus: SLP cues for small bites bolus hold and hard/fast swallow. Pt completes x5repetitions with puree, with x2 or more subsequent dry swallows to aid in pharyngeal clearance. Pt reporting challenges in clearing applesauce from pharnyx. Attributes to being grainy. Improved success with use of pudding.  Treatment comments: Pt encouraged to remain consistent with HEP completion pending 2 week break from ST d/t scheduling. Pt is demonstrating exercises accurately with mod-I in  today's session but is unable to tell SLP how many repetitions or frequency he is completing daily.  DYSARTHRIA TREATMENT:  Focused on slow rate, over articulation in simple conversation. Warm up of sustained vowel sounds completed with pt demonstrating over-articulation via exaggerated mouth movements. Pt required ongoing modeling and feedback to achieve use of targeted strategies in 60% of opportunities in conversation. Pt with tangential speech which reduced ability to focus on targeted skills. Intelligibility intact without but clarity improved with. Education re: tongue role in speech and potential benefit from lingual stretches addressed previously in session.   07/26/23:  DYSPHAGIA TREATMENT:   Patient directly observed with POs: Yes: dysphagia 1 (puree) and thin liquids  Feeding: able to feed self Liquids provided by: cup Oral phase signs and symptoms: anterior loss/spillage Pharyngeal phase signs and symptoms: multiple swallows, immediate throat clear, and delayed cough with expectoration  Therapeutic exercises:  EMST: set to 20 cm H2O, pt completes 2 sets of 5 repetitions with good accuracy with mod-I evidenced by short, strong burst of air releasing pressure valve. Pt rates effort as 3/10. SLP increased setting to ~25 cm H2O with pt continuing to demonstrate ability to perform correctly.  Effortful swallow with thin liquid bolus: SLP cues for small sips, bolus hold and hard/fast swallow. Pt completes x15 repetitions. With thin liquid boluses, pt eventually demonstrating delayed cough and expectoration. Pt believes expectoration to be mucus from PND. SLP provides education on slowing rate to allow full pharyngeal and esophageal clearance, requiring ongoing cues for appropriate pacing. With appropriate pacing, reduced coughing, burping and expectoration noted.  Treatment comments: SLP provided pt education regarding importance of oral care, pt reporting use of brush and paste daily. Is using  Sprite to rinse out mouth following PO trials. Reviewed swallow strategies: slow rate, small bites, liquid wash, moisten with sauce/gravy    DYSARTHRIA TREATMENT:  Reviewed previously instructed dysarthria strategies with focus on over-articulation. Targeted practice during automatic speech tasks with pt requiring usual model to increase accuracy of skill. In conversation, pt largely intelligible throughout today's conversation. 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 Pt with noted reduced lingual protrusion and elevation. Plan to instruct for active and passive lingual stretching.   07/13/23: Jeff [name] enters visibly and verbally frustrated as his ride arrived early and he was still in bed. He has not had a change to do tube feeds or take meds due to early ride. He forgot his EMST 150 device. Jeff [name] continues to complete HEP daily. He has increased PO intake with puree foods - He continues to state allergies are affecting his ability to swallow as well. Ongoing c/o significant pain in left throat and neck. Jeff [name] believes seasonal allergies affect his throat pain and result in him having to expectorate phlegm frequently. Reviewed strategies to manage secretion including hard swallows, sips, however he states he can't swallow due to allergies. PO trials of puree, water and  fruit punch. Successive thin liquid sips without difficulty, however delayed cough with expectoration after multiple swallows of puree. He reports he ate a whole pudding cup yesterday without difficulty. Generated soft foods he can try at home - see Patient Instructions. Provided a food log to track daily PO intake.  07/08/23: Pt shares daily HEP completion. SLP led pt through CTAR exercise, pt completing 4 sets of 10 reps with min-A and occasional cues. Pt completed 20x effortful swallows and 20x masako swallows. Pt reports limited understanding of  mendelson technique. SLP provided ongoing instruction to facilitate understanding, with pt reporting "I don't understand" "I can't do it" requiring max-A to complete ~5 repetitions. Pt shares leaving his EMST at home, but reports he has been practicing consistently. SLP initiated education of dysarthria strategies to support improved intelligibility with patient endorsing understanding. Pt able to demonstrate strategies in conversation with occasional cues and min-A. Hand out provided to support pt understanding and carryover.  06/29/23: Measure maximum expiratory pressure using manometer at 44 cm/H2O. Provided EMST 75 for expiratory muscle strength training  EMST- for dysphagia. Set device at about 20-25 cm/H2O - Jeff [name] demonstrated 5 sets of 5 reps on EMST with initial cues for labial seal and use of nose plug. Instructed him to complete 5 sets of 5 reps 3x a day. He brought in his neckline slimmer and demonstrated CTART with mod I. He demonstrated masako, effortful swallow and mendelson with rare min A.  PO trials of water and bite size Ritz cracker - Jeff [name] follows swallow precaution of multiple swallows and liquid wash. He is drinking regularly at home, however PO intake is minimal. Prolonged oral phase to masticate cracker due to oral pain. He is taking crackers, oreos at home in the same manner. Encouraged some more frequent soft solids at home as tolerated.   06/22/23: eval completed - Reviewed recommendations from MBSS and reviewed Masako, CTAR, [place] from [facility] ST with occasional min A. Instructed Jeff [name] to bring in respiratory trainers and CTAR resistance to next session. Reviewed oral care recommendations - Jeff [name] is consistent with oral care 2-3x a day.   PATIENT EDUCATION: Education details: See Treatment, See Patient Instructions; swallow precautions, diet modifications, oral care, HEP for dysphagia; dysarthria strategies Person educated: Patient Education method: Explanation, Demonstration, Verbal  cues, and Handouts Education comprehension: verbalized understanding, returned demonstration, verbal cues required, and needs further education   ASSESSMENT:  CLINICAL IMPRESSION: Patient is a 64 y.o. male who was seen today for severe oropharyngeal dysphagia s/p chemo and XRT for SCC base of tongue. He reports thick saliva and phlegm affecting his ability to swallow. He states this is due to allergies and the pollen. Reviewed late effects of XRT. He is  taking water and some tea at home, but unable to eat puree due to phlegm and changes in taste. He is completing HEP for dysphagia at home per [facility] ST, including Respiratory muscle strength training.  MBSS recommended NPO - pt has PEG continuous feeds at home. SLP initiated dysarthria strategies this date to target reduced intelligibility with pt able to demonstrate in conversation. I recommend skilled ST to maximize safety of swallow and to increase PO intake as well as intelligibility.  OBJECTIVE IMPAIRMENTS: include dysarthria and dysphagia. These impairments are limiting patient from effectively communicating at home and in community and safety when swallowing. Factors affecting potential to achieve goals and functional outcome are medical prognosis. Patient will benefit from skilled SLP services to address above impairments and improve overall function.  REHAB POTENTIAL: Fair due to h/o XRT, SCC tongue   GOALS: Goals reviewed with patient? Yes  SHORT TERM GOALS: Target date: 07/20/23  Pt will complete HEP for dysphagia with rare min A Baseline: occasional min A Goal status: ONGOING  2.  Pt will follow swallow precautions on trials of Dysphagia 1 and thin liquids with rare min A Baseline: did not follow precautions Goal status: ONGOING  3.  Pt will follow diet and oral care recommendations with rare min A Baseline: occasional min A Goal status: ONGOING  4.  Pt will carryover compensatory strategies for dysarthria including self  advocating to successfully give his name, DOB and address to transportation dispatcher Baseline: needed A to be understood  Goal status: ONGOING    LONG TERM GOALS: Target date: 08/17/23  Pt will complete HEP for dysphagia with mod I Baseline:  Goal status: occasional min A  2.  Pt will follow swallow precautions with mod I Baseline:  Goal status: did not follow precuations  3.  Pt will tolerate least restrictive diet following repeat swallow study Baseline:  Goal status: NPO  4.  Pt will use compensatory strategies to support intelligibility with health care providers Baseline:  Goal status: not understood by transportation   PLAN:  SLP FREQUENCY: 2x/week  SLP DURATION: 8 weeks  PLANNED INTERVENTIONS: Aspiration precaution training, Pharyngeal strengthening exercises, Diet toleration management , Environmental controls, Trials of upgraded texture/liquids, Cueing hierachy, Internal/external aids, Functional tasks, Multimodal communication approach, and SLP instruction and feedback, MBSS    Alston Jerry, CCC-SLP 07/28/2023, 2:19 PM

## 2023-08-02 DIAGNOSIS — Z7689 Persons encountering health services in other specified circumstances: Secondary | ICD-10-CM | POA: Diagnosis not present

## 2023-08-02 DIAGNOSIS — G893 Neoplasm related pain (acute) (chronic): Secondary | ICD-10-CM | POA: Diagnosis not present

## 2023-08-02 DIAGNOSIS — G894 Chronic pain syndrome: Secondary | ICD-10-CM | POA: Diagnosis not present

## 2023-08-02 DIAGNOSIS — C01 Malignant neoplasm of base of tongue: Secondary | ICD-10-CM | POA: Diagnosis not present

## 2023-08-05 DIAGNOSIS — I700 Atherosclerosis of aorta: Secondary | ICD-10-CM | POA: Diagnosis not present

## 2023-08-05 DIAGNOSIS — R918 Other nonspecific abnormal finding of lung field: Secondary | ICD-10-CM | POA: Diagnosis not present

## 2023-08-05 DIAGNOSIS — R911 Solitary pulmonary nodule: Secondary | ICD-10-CM | POA: Diagnosis not present

## 2023-08-05 DIAGNOSIS — J439 Emphysema, unspecified: Secondary | ICD-10-CM | POA: Diagnosis not present

## 2023-08-05 DIAGNOSIS — Z7689 Persons encountering health services in other specified circumstances: Secondary | ICD-10-CM | POA: Diagnosis not present

## 2023-08-05 DIAGNOSIS — C029 Malignant neoplasm of tongue, unspecified: Secondary | ICD-10-CM | POA: Diagnosis not present

## 2023-08-05 DIAGNOSIS — C01 Malignant neoplasm of base of tongue: Secondary | ICD-10-CM | POA: Diagnosis not present

## 2023-08-14 DIAGNOSIS — C01 Malignant neoplasm of base of tongue: Secondary | ICD-10-CM | POA: Diagnosis not present

## 2023-08-16 ENCOUNTER — Ambulatory Visit: Payer: Self-pay | Attending: Oncology | Admitting: Speech Pathology

## 2023-08-16 DIAGNOSIS — R471 Dysarthria and anarthria: Secondary | ICD-10-CM | POA: Diagnosis not present

## 2023-08-16 DIAGNOSIS — Z7689 Persons encountering health services in other specified circumstances: Secondary | ICD-10-CM | POA: Diagnosis not present

## 2023-08-16 DIAGNOSIS — R1312 Dysphagia, oropharyngeal phase: Secondary | ICD-10-CM | POA: Insufficient documentation

## 2023-08-16 NOTE — Therapy (Signed)
 OUTPATIENT SPEECH LANGUAGE PATHOLOGY SWALLOW THERAPY (recert)   Patient Name: Jeff [name] MRN: [id number] DOB:October 20, 1960, 63 y.o., male Today's Date: 08/16/2023  PCP: Alethia Huxley, MD REFERRING PROVIDER: Alethia Huxley, MD  END OF SESSION:  End of Session - 08/16/23 1535     Visit Number 7    Number of Visits 10    Date for SLP Re-Evaluation 08/31/23   +2 weeks for recert   Authorization Type medicaid - request 10 visits    Authorization Time Period per referral notes, HH used 9 visits, however was discharging so possibly 10 HH visits used - I asked FO to check    SLP Start Time 1530    SLP Stop Time  1615    SLP Time Calculation (min) 45 min    Activity Tolerance Patient tolerated treatment well              No past medical history on file. Past Surgical History:  Procedure Laterality Date   NO PAST SURGERIES     ORIF ANKLE FRACTURE Left 01/24/2015   Procedure: OPEN REDUCTION INTERNAL FIXATION (ORIF) LEFT ANKLE FRACTURE;  Surgeon: Timothy Ford, MD;  Location: [facility] OR;  Service: Orthopedics;  Laterality: Left;   Patient Active Problem List   Diagnosis Date Noted   Metabolic alkalosis 12/24/2022   Constipation 12/24/2022   Lactic acidosis 12/24/2022   Leak of percutaneous jejunostomy tube (HCC) 12/24/2022   Dehydration 12/23/2022   Iron deficiency anemia 11/13/2022   Cachexia (HCC) 11/12/2022   Severe protein-calorie malnutrition (HCC) 11/12/2022   Squamous cell carcinoma of tongue (HCC) 11/12/2022   Inadequate oral intake 11/11/2022   Squamous cell carcinoma of base of tongue (HCC) 11/10/2022   Dysphagia 09/28/2022   Smoker 09/28/2022    ONSET DATE: 05/26/2023 referral date  REFERRING DIAG: R13.10 (ICD-10-CM) - Dysphagia, unspecified  THERAPY DIAG:  Dysphagia, oropharyngeal phase  Dysarthria and anarthria  Rationale for Evaluation and Treatment: Rehabilitation  SUBJECTIVE:   SUBJECTIVE STATEMENT: Pt reports has been completing swallow exercises, has  been eating PO, soft solids. Ongoing complaints of throat pain, denies need to go to MD.   PERTINENT HISTORY: Squamous cell carcinoma of the base of tongue, stage IVA (cT3 cN2c), p16 negative, status post concurrent chemoradiotherapy. Last chemo 02/15/23. G tube placed 11/12/22.   1. Squamous cell carcinoma of base of tongue (CMD)  2. History of radiation therapy    PAIN:  Are you having pain? Yes: NPRS scale: 6/10 Pain location: left mouth and throat Pain description: ache Aggravating factors: talking, chewing Relieving factors: gabapentin, magic mouthwash, warm tea   FALLS: Has patient fallen in last 6 months?  No  LIVING ENVIRONMENT: Lives with: lives alone Lives in: House/apartment  PLOF:  Level of assistance: Independent with ADLs Employment: On disability  PATIENT GOALS: "To eat again"  OBJECTIVE:  Note: Objective measures were completed at Evaluation unless otherwise noted. OBJECTIVE:   DIAGNOSTIC FINDINGS:   INSTRUMENTAL SWALLOW STUDY FINDINGS (MBSS) 05/17/23  Objective swallow impairments: Severe oropharyngeal dysphagia characterized by consistent penetration during swallow, poor UES function and pharyngeal weakness leading to regurgitation into hypopharynx with spillover into laryngeal vestibule and trickle down aspiration. Copious amounts of secretions during evaluation- patient demonstrating moderate amount of difficulty/effort for expectorating/clearing. Intact sensorium, spontaneous, reflexive throat clear and cough response to penetration/aspiration events, effective with ejection of penetrated material. Intact labial seal with no anterior spillage. Patient edentulous and unable to masticate. Poor lingual manipulation and A-P transit. Poor oral containment with premature spillage to  the pyriform sinuses. Noted BOT atrophy with decreased amount of tissue, along with concerns for shortened epiglottis. Decreased laryngeal elevation, nearly absent hyoid excursion. Patient  able to initially compensate and complete closure of laryngeal vestibule during the swallow; patient with decreased pharyngeal constriction and significant UES dysfunction leading to impeding bolus passage and retrograde movement into the hypopharynx, resulting in spillover into laryngeal vestibule and intermittent trickle down aspiration. Intact sensorium with immediate, spontaneous cough and throat clear response. Grossly effective for ejection of penetrated material but not all aspirated material. Chin tuck implemented with improved closure of laryngeal vestibule, but due to UES dysfunction and regurgitation into hypopharynx, continued trickle down penetration/aspiration occurred post-swallow. With pureed texture (applesauce), pt was unable to swallow initial bolus due to xerostomia, patient reported need to expectorate. SLP mixed thin barium liquid into applesauce/barium, with improved transport into pharynx, but mod-severe difficulty with swallow completion and passage through UES. Pt given multiple large bolus thin liquid washes after pureed textures, with intermittent penetration to variable depths, but no visualized aspiration.   Objective recommended compensations: RECOMMENDATIONS Diet: NPO Aspiration precautions:  sit upright during all PO (as close to 90 degrees as possible) only feed when awake/alert Clear  Medications: via alternate means  COGNITION: Overall cognitive status: Within functional limits for tasks assessed  SUBJECTIVE DYSPHAGIA REPORTS:  Date of onset: G tube placed 11/12/22 Reported symptoms: odynophagia, globus sensation, xerostomia, and thick secretions and phlegm  Current diet: thin liquids and PEG TF  Co-morbid voice changes: No  FACTORS WHICH MAY INCREASE RISK OF ADVERSE EVENT IN PRESENCE OF ASPIRATION:  General health: frail or deconditioned  Risk factors: weak cough and tube present (PEG)     ORAL MOTOR EXAMINATION: Overall status: Impaired:   Labial:  Bilateral (ROM and Coordination) Lingual: Bilateral (ROM, Symmetry, Strength, and Coordination) Comments:   CLINICAL SWALLOW ASSESSMENT:   Dentition: edentulous Vocal quality at baseline: normal Patient directly observed with POs: Yes: dysphagia 1 (puree) and thin liquids  Feeding: able to feed self Liquids provided by: cup [facility] Swallow Protocol: N/A see MBSS Oral phase signs and symptoms: none with puree or thin liquids Pharyngeal phase signs and symptoms: suspected delayed swallow initiation, multiple swallows, wet vocal quality, immediate throat clear, immediate cough, delayed cough, complaints of residue, and complaints of globus                                                                                                                         TREATMENT DATE:  08/16/23:  DYSPHAGIA TREATMENT:   Patient directly observed with POs: Yes: thin liquids  Liquids provided by: cup Oral phase signs and symptoms: anterior loss/spillage Pharyngeal phase signs and symptoms: multiple swallows, immediate throat clear, and delayed cough with expectoration  Therapeutic exercises:  Lingual stretching exercises- active and assisted. Pt completed anterior and anterolateral extensions with 5 second hold x5. Pt noting sensation of stretch at base of tongue throughout. Pt then completed same stretches with use of digital assist to enhance  ROM, each direction x5. EMST: did not address this date Effortful swallow with puree bolus: SLP cues for bolus hold and hard/fast swallow. Pt completes x5 repetitions with thin liquid, with x2 or more subsequent dry swallows to aid in pharyngeal clearance. Pt reporting challenges in clearing applesauce from pharnyx. Attributes to being grainy. Improved success with use of pudding.  Treatment comments: Pt reports desire for full PO diet, discussed increasing intake slowly, monitoring for weight gain. Encouraged collaboration with RD to address adequate nutrients and  calories. Discussed repeat MBSS, SLP to place order.   DYSARTHRIA TREATMENT:  Addressed dysarthria compensations during sentence level reading task with supervision cues following review of strategies. Pt with 90% accuracy. At generative phrase level, pt with 100% accuracy. Pt uses slowed rate and over articulation consistently during structured practice. Ongoing cues needed to carryover to conversational speech. Pt resistant to SLP feedback on increased rate in conversational speech. Pt attempting to expectorate spit, citing basis for decreased intelligibility. SLP cues for swallowing saliva. Discussed using water to thin thick secretions. Pt reports no issues with communication with transportation agency scheduling d/t speech.   PATIENT EDUCATION: Education details: See Treatment, See Patient Instructions; swallow precautions, diet modifications, oral care, HEP for dysphagia; dysarthria strategies Person educated: Patient Education method: Explanation, Demonstration, Verbal cues, and Handouts Education comprehension: verbalized understanding, returned demonstration, verbal cues required, and needs further education   ASSESSMENT:  CLINICAL IMPRESSION: Patient is a 63 y.o. male who was seen today for severe oropharyngeal dysphagia s/p chemo and XRT for SCC base of tongue. He reports thick saliva and phlegm affecting his ability to swallow. He states this is due to allergies and the pollen. Reviewed late effects of XRT. Pt has increased oral intake to include thin liquids, dysphagia 1 & 2.  Pt reports ongoing compliance with HEP, is demonstrating exercises with rare min-A verbal cues. SLP initiated dysarthria strategies this date to target reduced intelligibility with pt able to demonstrate in conversation. Pt continues to benefit from ST. Plan to complete repeat MBSS to assess current functional status.   OBJECTIVE IMPAIRMENTS: include dysarthria and dysphagia. These impairments are limiting patient  from effectively communicating at home and in community and safety when swallowing. Factors affecting potential to achieve goals and functional outcome are medical prognosis. Patient will benefit from skilled SLP services to address above impairments and improve overall function.  REHAB POTENTIAL: Fair due to h/o XRT, SCC tongue   GOALS: Goals reviewed with patient? Yes  SHORT TERM GOALS: Target date: 07/20/23  Pt will complete HEP for dysphagia with rare min A Baseline: occasional min A Goal status: Partially met  2.  Pt will follow swallow precautions on trials of Dysphagia 1 and thin liquids with rare min A Baseline: did not follow precautions Goal status: MET  3.  Pt will follow diet and oral care recommendations with rare min A Baseline: occasional min A Goal status: partially met  4.  Pt will carryover compensatory strategies for dysarthria including self advocating to successfully give his name, DOB and address to transportation dispatcher Baseline: needed A to be understood  Goal status: MET    LONG TERM GOALS: Target date: 08/31/23  Pt will complete HEP for dysphagia with mod I Baseline:  Goal status: occasional min A  2.  Pt will follow swallow precautions with mod I Baseline:  Goal status: did not follow precuations  3.  Pt will tolerate least restrictive diet following repeat swallow study Baseline:  Goal status: NPO  4.  Pt will use compensatory strategies to support intelligibility with health care providers Baseline:  Goal status: not understood by transportation   PLAN:  SLP FREQUENCY: 2x/week  SLP DURATION: 8 weeks +2 weeks for recert   PLANNED INTERVENTIONS: Aspiration precaution training, Pharyngeal strengthening exercises, Diet toleration management , Environmental controls, Trials of upgraded texture/liquids, Cueing hierachy, Internal/external aids, Functional tasks, Multimodal communication approach, and SLP instruction and feedback,  MBSS    Alston Jerry, CCC-SLP 08/16/2023, 3:52 PM

## 2023-08-17 ENCOUNTER — Other Ambulatory Visit (HOSPITAL_COMMUNITY): Payer: Self-pay | Admitting: Oncology

## 2023-08-17 DIAGNOSIS — R1310 Dysphagia, unspecified: Secondary | ICD-10-CM

## 2023-08-22 DIAGNOSIS — Z7689 Persons encountering health services in other specified circumstances: Secondary | ICD-10-CM | POA: Diagnosis not present

## 2023-08-22 DIAGNOSIS — Z9109 Other allergy status, other than to drugs and biological substances: Secondary | ICD-10-CM | POA: Diagnosis not present

## 2023-08-22 DIAGNOSIS — Z85810 Personal history of malignant neoplasm of tongue: Secondary | ICD-10-CM | POA: Diagnosis not present

## 2023-08-22 DIAGNOSIS — R1314 Dysphagia, pharyngoesophageal phase: Secondary | ICD-10-CM | POA: Diagnosis not present

## 2023-08-23 NOTE — Therapy (Deleted)
 OUTPATIENT SPEECH LANGUAGE PATHOLOGY SWALLOW THERAPY (recert)   Patient Name: MATHEUS SPIKER MRN: [id number] DOB:10/21/1960, 63 y.o., male Today's Date: 08/23/2023  PCP: Alethia Huxley, MD REFERRING PROVIDER: Alethia Huxley, MD  END OF SESSION:     No past medical history on file. Past Surgical History:  Procedure Laterality Date   NO PAST SURGERIES     ORIF ANKLE FRACTURE Left 01/24/2015   Procedure: OPEN REDUCTION INTERNAL FIXATION (ORIF) LEFT ANKLE FRACTURE;  Surgeon: Timothy Ford, MD;  Location: [facility] OR;  Service: Orthopedics;  Laterality: Left;   Patient Active Problem List   Diagnosis Date Noted   Metabolic alkalosis 12/24/2022   Constipation 12/24/2022   Lactic acidosis 12/24/2022   Leak of percutaneous jejunostomy tube (HCC) 12/24/2022   Dehydration 12/23/2022   Iron deficiency anemia 11/13/2022   Cachexia (HCC) 11/12/2022   Severe protein-calorie malnutrition (HCC) 11/12/2022   Squamous cell carcinoma of tongue (HCC) 11/12/2022   Inadequate oral intake 11/11/2022   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  Rationale for Evaluation and Treatment: Rehabilitation  SUBJECTIVE:   SUBJECTIVE STATEMENT: Pt reports has been completing swallow exercises, has been eating PO, soft solids. Ongoing complaints of throat pain, denies need to go to MD.   PERTINENT HISTORY: Squamous cell carcinoma of the base of tongue, stage IVA (cT3 cN2c), p16 negative, status post concurrent chemoradiotherapy. Last chemo 02/15/23. G tube placed 11/12/22.   1. Squamous cell carcinoma of base of tongue (CMD)  2. History of radiation therapy    PAIN:  Are you having pain? Yes: NPRS scale: 6/10 Pain location: left mouth and throat Pain description: ache Aggravating factors: talking,  chewing Relieving factors: gabapentin, magic mouthwash, warm tea   FALLS: Has patient fallen in last 6 months?  No  LIVING ENVIRONMENT: Lives with: lives alone Lives in: House/apartment  PLOF:  Level of assistance: Independent with ADLs Employment: On disability  PATIENT GOALS: "To eat again"  OBJECTIVE:  Note: Objective measures were completed at Evaluation unless otherwise noted. OBJECTIVE:   DIAGNOSTIC FINDINGS:   INSTRUMENTAL SWALLOW STUDY FINDINGS (MBSS) 05/17/23  Objective swallow impairments: Severe oropharyngeal dysphagia characterized by consistent penetration during swallow, poor UES function and pharyngeal weakness leading to regurgitation into hypopharynx with spillover into laryngeal vestibule and trickle down aspiration. Copious amounts of secretions during evaluation- patient demonstrating moderate amount of difficulty/effort for expectorating/clearing. Intact sensorium, spontaneous, reflexive throat clear and cough response to penetration/aspiration events, effective with ejection of penetrated material. Intact labial seal with no anterior spillage. Patient edentulous and unable to masticate. Poor lingual manipulation and A-P transit. Poor oral containment with premature spillage to the pyriform sinuses. Noted BOT atrophy with decreased amount of tissue, along with concerns for shortened epiglottis. Decreased laryngeal elevation, nearly absent hyoid excursion. Patient able to initially compensate and complete closure of laryngeal vestibule during the swallow; patient with decreased pharyngeal constriction and significant UES dysfunction leading to impeding bolus passage and retrograde movement into the hypopharynx, resulting in spillover into laryngeal vestibule and intermittent trickle down aspiration. Intact sensorium with immediate, spontaneous cough and throat clear response. Grossly effective for ejection of penetrated material but not all aspirated material. Chin tuck  implemented with improved closure of laryngeal vestibule, but due to UES dysfunction and regurgitation into hypopharynx, continued trickle down penetration/aspiration occurred post-swallow. With pureed  texture (applesauce), pt was unable to swallow initial bolus due to xerostomia, patient reported need to expectorate. SLP mixed thin barium liquid into applesauce/barium, with improved transport into pharynx, but mod-severe difficulty with swallow completion and passage through UES. Pt given multiple large bolus thin liquid washes after pureed textures, with intermittent penetration to variable depths, but no visualized aspiration.   Objective recommended compensations: RECOMMENDATIONS Diet: NPO Aspiration precautions:  sit upright during all PO (as close to 90 degrees as possible) only feed when awake/alert Clear  Medications: via alternate means  COGNITION: Overall cognitive status: Within functional limits for tasks assessed  SUBJECTIVE DYSPHAGIA REPORTS:  Date of onset: G tube placed 11/12/22 Reported symptoms: odynophagia, globus sensation, xerostomia, and thick secretions and phlegm  Current diet: thin liquids and PEG TF  Co-morbid voice changes: No  FACTORS WHICH MAY INCREASE RISK OF ADVERSE EVENT IN PRESENCE OF ASPIRATION:  General health: frail or deconditioned  Risk factors: weak cough and tube present (PEG)     ORAL MOTOR EXAMINATION: Overall status: Impaired:   Labial: Bilateral (ROM and Coordination) Lingual: Bilateral (ROM, Symmetry, Strength, and Coordination) Comments:   CLINICAL SWALLOW ASSESSMENT:   Dentition: edentulous Vocal quality at baseline: normal Patient directly observed with POs: Yes: dysphagia 1 (puree) and thin liquids  Feeding: able to feed self Liquids provided by: cup [facility] Swallow Protocol: N/A see MBSS Oral phase signs and symptoms: none with puree or thin liquids Pharyngeal phase signs and symptoms: suspected delayed swallow initiation, multiple  swallows, wet vocal quality, immediate throat clear, immediate cough, delayed cough, complaints of residue, and complaints of globus                                                                                                                         TREATMENT DATE:  08/24/23:  08/16/23:  DYSPHAGIA TREATMENT:   Patient directly observed with POs: Yes: thin liquids  Liquids provided by: cup Oral phase signs and symptoms: anterior loss/spillage Pharyngeal phase signs and symptoms: multiple swallows, immediate throat clear, and delayed cough with expectoration  Therapeutic exercises:  Lingual stretching exercises- active and assisted. Pt completed anterior and anterolateral extensions with 5 second hold x5. Pt noting sensation of stretch at base of tongue throughout. Pt then completed same stretches with use of digital assist to enhance ROM, each direction x5. EMST: did not address this date Effortful swallow with puree bolus: SLP cues for bolus hold and hard/fast swallow. Pt completes x5 repetitions with thin liquid, with x2 or more subsequent dry swallows to aid in pharyngeal clearance. Pt reporting challenges in clearing applesauce from pharnyx. Attributes to being grainy. Improved success with use of pudding.  Treatment comments: Pt reports desire for full PO diet, discussed increasing intake slowly, monitoring for weight gain. Encouraged collaboration with RD to address adequate nutrients and calories. Discussed repeat MBSS, SLP to place order.   DYSARTHRIA TREATMENT:  Addressed dysarthria compensations during sentence level reading task with supervision cues following review of strategies. Pt  with 90% accuracy. At generative phrase level, pt with 100% accuracy. Pt uses slowed rate and over articulation consistently during structured practice. Ongoing cues needed to carryover to conversational speech. Pt resistant to SLP feedback on increased rate in conversational speech. Pt attempting to  expectorate spit, citing basis for decreased intelligibility. SLP cues for swallowing saliva. Discussed using water to thin thick secretions. Pt reports no issues with communication with transportation agency scheduling d/t speech.   PATIENT EDUCATION: Education details: See Treatment, See Patient Instructions; swallow precautions, diet modifications, oral care, HEP for dysphagia; dysarthria strategies Person educated: Patient Education method: Explanation, Demonstration, Verbal cues, and Handouts Education comprehension: verbalized understanding, returned demonstration, verbal cues required, and needs further education   ASSESSMENT:  CLINICAL IMPRESSION: Patient is a 63 y.o. male who was seen today for severe oropharyngeal dysphagia s/p chemo and XRT for SCC base of tongue. He reports thick saliva and phlegm affecting his ability to swallow. He states this is due to allergies and the pollen. Reviewed late effects of XRT. Pt has increased oral intake to include thin liquids, dysphagia 1 & 2.  Pt reports ongoing compliance with HEP, is demonstrating exercises with rare min-A verbal cues. SLP initiated dysarthria strategies this date to target reduced intelligibility with pt able to demonstrate in conversation. Pt continues to benefit from ST. Plan to complete repeat MBSS to assess current functional status.   OBJECTIVE IMPAIRMENTS: include dysarthria and dysphagia. These impairments are limiting patient from effectively communicating at home and in community and safety when swallowing. Factors affecting potential to achieve goals and functional outcome are medical prognosis. Patient will benefit from skilled SLP services to address above impairments and improve overall function.  REHAB POTENTIAL: Fair due to h/o XRT, SCC tongue   GOALS: Goals reviewed with patient? Yes  SHORT TERM GOALS: Target date: 07/20/23  Pt will complete HEP for dysphagia with rare min A Baseline: occasional min A Goal  status: Partially met  2.  Pt will follow swallow precautions on trials of Dysphagia 1 and thin liquids with rare min A Baseline: did not follow precautions Goal status: MET  3.  Pt will follow diet and oral care recommendations with rare min A Baseline: occasional min A Goal status: partially met  4.  Pt will carryover compensatory strategies for dysarthria including self advocating to successfully give his name, DOB and address to transportation dispatcher Baseline: needed A to be understood  Goal status: MET    LONG TERM GOALS: Target date: 08/31/23  Pt will complete HEP for dysphagia with mod I Baseline:  Goal status: occasional min A  2.  Pt will follow swallow precautions with mod I Baseline:  Goal status: did not follow precuations  3.  Pt will tolerate least restrictive diet following repeat swallow study Baseline:  Goal status: NPO  4.  Pt will use compensatory strategies to support intelligibility with health care providers Baseline:  Goal status: not understood by transportation   PLAN:  SLP FREQUENCY: 2x/week  SLP DURATION: 8 weeks +2 weeks for recert   PLANNED INTERVENTIONS: Aspiration precaution training, Pharyngeal strengthening exercises, Diet toleration management , Environmental controls, Trials of upgraded texture/liquids, Cueing hierachy, Internal/external aids, Functional tasks, Multimodal communication approach, and SLP instruction and feedback, MBSS    Tamar Fairly, CCC-SLP 08/23/2023, 11:30 AM

## 2023-08-24 ENCOUNTER — Ambulatory Visit: Payer: Self-pay

## 2023-08-24 DIAGNOSIS — Z7689 Persons encountering health services in other specified circumstances: Secondary | ICD-10-CM | POA: Diagnosis not present

## 2023-08-25 DIAGNOSIS — Z419 Encounter for procedure for purposes other than remedying health state, unspecified: Secondary | ICD-10-CM | POA: Diagnosis not present

## 2023-08-25 DIAGNOSIS — C01 Malignant neoplasm of base of tongue: Secondary | ICD-10-CM | POA: Diagnosis not present

## 2023-08-29 ENCOUNTER — Ambulatory Visit: Payer: Self-pay | Admitting: Speech Pathology

## 2023-08-29 DIAGNOSIS — R471 Dysarthria and anarthria: Secondary | ICD-10-CM | POA: Diagnosis not present

## 2023-08-29 DIAGNOSIS — R1312 Dysphagia, oropharyngeal phase: Secondary | ICD-10-CM | POA: Diagnosis not present

## 2023-08-29 NOTE — Therapy (Signed)
 OUTPATIENT SPEECH LANGUAGE PATHOLOGY SWALLOW THERAPY (recert)   Patient Name: Jeff [name] MRN: [id number] DOB:13-Dec-1960, 63 y.o., male Today's Date: 08/29/2023  PCP: Alethia Huxley, MD REFERRING PROVIDER: Alethia Huxley, MD  END OF SESSION:  End of Session - 08/29/23 1316     Visit Number 8    Number of Visits 10    Authorization Type medicaid - request 10 visits    Authorization Time Period per referral notes, HH used 9 visits, however was discharging so possibly 10 HH visits used - I asked FO to check    SLP Start Time 1316    SLP Stop Time  1353    SLP Time Calculation (min) 37 min    Activity Tolerance Patient limited by pain           No past medical history on file. Past Surgical History:  Procedure Laterality Date   NO PAST SURGERIES     ORIF ANKLE FRACTURE Left 01/24/2015   Procedure: OPEN REDUCTION INTERNAL FIXATION (ORIF) LEFT ANKLE FRACTURE;  Surgeon: Timothy Ford, MD;  Location: [facility] OR;  Service: Orthopedics;  Laterality: Left;   Patient Active Problem List   Diagnosis Date Noted   Metabolic alkalosis 12/24/2022   Constipation 12/24/2022   Lactic acidosis 12/24/2022   Leak of percutaneous jejunostomy tube (HCC) 12/24/2022   Dehydration 12/23/2022   Iron deficiency anemia 11/13/2022   Cachexia (HCC) 11/12/2022   Severe protein-calorie malnutrition (HCC) 11/12/2022   Squamous cell carcinoma of tongue (HCC) 11/12/2022   Inadequate oral intake 11/11/2022   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 Last chemo 02/15/23. G tube placed 11/12/22.   1. Squamous cell carcinoma of base of tongue (CMD)  2. History of radiation therapy    PAIN:  Are you having pain? Yes: NPRS scale: 10/10 Pain location: left mouth , tongue and throat Pain description: ache, sharp, HEP Aggravating factors: talking, chewing Relieving factors: gabapentin, BC powder  FALLS: Has patient fallen in last 6 months?  No  LIVING ENVIRONMENT: Lives with: lives alone Lives in: House/apartment  PLOF:  Level of assistance: Independent with ADLs Employment: On disability  PATIENT GOALS: To eat again  OBJECTIVE:  Note: Objective measures were completed at Evaluation unless otherwise noted. OBJECTIVE:   DIAGNOSTIC FINDINGS:   INSTRUMENTAL SWALLOW STUDY FINDINGS (MBSS) 05/17/23  Objective swallow impairments: Severe oropharyngeal dysphagia characterized by consistent penetration during swallow, poor UES function and pharyngeal weakness leading to regurgitation into hypopharynx with spillover into laryngeal vestibule and trickle down aspiration. Copious amounts of secretions during evaluation- patient demonstrating moderate amount of difficulty/effort for expectorating/clearing. Intact sensorium, spontaneous, reflexive throat clear and cough response to penetration/aspiration events, effective with ejection of penetrated material. Intact labial seal with no anterior spillage. Patient edentulous and unable to masticate. Poor lingual manipulation and A-P transit. Poor oral containment with premature spillage to the pyriform sinuses. Noted BOT atrophy with decreased amount of tissue, along with concerns for shortened epiglottis. Decreased  laryngeal elevation, nearly absent hyoid excursion. Patient able to initially compensate and complete closure of laryngeal vestibule during the swallow;  patient with decreased pharyngeal constriction and significant UES dysfunction leading to impeding bolus passage and retrograde movement into the hypopharynx, resulting in spillover into laryngeal vestibule and intermittent trickle down aspiration. Intact sensorium with immediate, spontaneous cough and throat clear response. Grossly effective for ejection of penetrated material but not all aspirated material. Chin tuck implemented with improved closure of laryngeal vestibule, but due to UES dysfunction and regurgitation into hypopharynx, continued trickle down penetration/aspiration occurred post-swallow. With pureed texture (applesauce), pt was unable to swallow initial bolus due to xerostomia, patient reported need to expectorate. SLP mixed thin barium liquid into applesauce/barium, with improved transport into pharynx, but mod-severe difficulty with swallow completion and passage through UES. Pt given multiple large bolus thin liquid washes after pureed textures, with intermittent penetration to variable depths, but no visualized aspiration.   Objective recommended compensations: RECOMMENDATIONS Diet: NPO Aspiration precautions:  sit upright during all PO (as close to 90 degrees as possible) only feed when awake/alert Clear  Medications: via alternate means  COGNITION: Overall cognitive status: Within functional limits for tasks assessed  SUBJECTIVE DYSPHAGIA REPORTS:  Date of onset: G tube placed 11/12/22 Reported symptoms: odynophagia, globus sensation, xerostomia, and thick secretions and phlegm  Current diet: thin liquids and PEG TF  Co-morbid voice changes: No  FACTORS WHICH MAY INCREASE RISK OF ADVERSE EVENT IN PRESENCE OF ASPIRATION:  General health: frail or deconditioned  Risk factors: weak cough and tube present (PEG)     ORAL MOTOR EXAMINATION: Overall status: Impaired:   Labial: Bilateral (ROM and Coordination) Lingual: Bilateral (ROM, Symmetry, Strength, and  Coordination) Comments:   CLINICAL SWALLOW ASSESSMENT:   Dentition: edentulous Vocal quality at baseline: normal Patient directly observed with POs: Yes: dysphagia 1 (puree) and thin liquids  Feeding: able to feed self Liquids provided by: cup [facility] Swallow Protocol: N/A see MBSS Oral phase signs and symptoms: none with puree or thin liquids Pharyngeal phase signs and symptoms: suspected delayed swallow initiation, multiple swallows, wet vocal quality, immediate throat clear, immediate cough, delayed cough, complaints of residue, and complaints of globus                                                                                                                         TREATMENT DATE:   08/29/23: Dysphagia: Butch has increased his PO intake and is working on swallowing instead of expectorating - He completed HEP with rare min A for effortful swallow with liquid sips with cues to swallow hard and fast - 5 reps with thin juice sips, 4 reps of pudding each sip followed by multiple swallow to clear pharynx. He completed lingual stretch 3x with 5 second holds - after 3 reps, stopped due to c/o pain. He forgot EMST, but is completing this at home consistently. PO trials and HEP limited due to sharp pain in tongue and throat. Ongoing cues to  swallow secretions rather than expectorate. Pain affecting progress in ST - today Butch became tearful due to pain affecting his ability to eat and complete his exercises. BC does help his pain, however he has not taken this today. I encourage him to take pain meds prior to MBSS. Tearfulness continued, therapy deferred due to pain. He states when he doesn't have pain, he completes HEP and uses speech strategies. Today he relays that speech is making his pain worse. I encourage his to get referral to laryngologist pending results of MBSS this week.      DYSPHAGIA TREATMENT:   Patient directly observed with POs: Yes: thin liquids  Liquids provided by: cup Oral  phase signs and symptoms: anterior loss/spillage Pharyngeal phase signs and symptoms: multiple swallows, immediate throat clear, and delayed cough with expectoration  Therapeutic exercises:  Lingual stretching exercises- active and assisted. Pt completed anterior and anterolateral extensions with 5 second hold x5. Pt noting sensation of stretch at base of tongue throughout. Pt then completed same stretches with use of digital assist to enhance ROM, each direction x5. EMST: did not address this date Effortful swallow with puree bolus: SLP cues for bolus hold and hard/fast swallow. Pt completes x5 repetitions with thin liquid, with x2 or more subsequent dry swallows to aid in pharyngeal clearance. Pt reporting challenges in clearing applesauce from pharnyx. Attributes to being grainy. Improved success with use of pudding.  Treatment comments: Pt reports desire for full PO diet, discussed increasing intake slowly, monitoring for weight gain. Encouraged collaboration with RD to address adequate nutrients and calories. Discussed repeat MBSS, SLP to place order.   DYSARTHRIA TREATMENT:  Addressed dysarthria compensations during sentence level reading task with supervision cues following review of strategies. Pt with 90% accuracy. At generative phrase level, pt with 100% accuracy. Pt uses slowed rate and over articulation consistently during structured practice. Ongoing cues needed to carryover to conversational speech. Pt resistant to SLP feedback on increased rate in conversational speech. Pt attempting to expectorate spit, citing basis for decreased intelligibility. SLP cues for swallowing saliva. Discussed using water to thin thick secretions. Pt reports no issues with communication with transportation agency scheduling d/t speech.   PATIENT EDUCATION: Education details: See Treatment, See Patient Instructions; swallow precautions, diet modifications, oral care, HEP for dysphagia; dysarthria  strategies Person educated: Patient Education method: Explanation, Demonstration, Verbal cues, and Handouts Education comprehension: verbalized understanding, returned demonstration, verbal cues required, and needs further education   ASSESSMENT:  CLINICAL IMPRESSION: Patient is a 63 y.o. male who was seen today for severe oropharyngeal dysphagia s/p chemo and XRT for SCC base of tongue. He reports thick saliva and phlegm affecting his ability to swallow. He states this is due to allergies and the pollen. Reviewed late effects of XRT. Pt has increased oral intake to include thin liquids, dysphagia 1 & 2.  Pt reports ongoing compliance with HEP, is demonstrating exercises with rare min-A verbal cues. SLP initiated dysarthria strategies this date to target reduced intelligibility with pt able to demonstrate in conversation. Pt continues to benefit from ST. Plan to complete repeat MBSS to assess current functional status.   OBJECTIVE IMPAIRMENTS: include dysarthria and dysphagia. These impairments are limiting patient from effectively communicating at home and in community and safety when swallowing. Factors affecting potential to achieve goals and functional outcome are medical prognosis. Patient will benefit from skilled SLP services to address above impairments and improve overall function.  REHAB POTENTIAL: Fair due to h/o XRT, SCC tongue  GOALS: Goals reviewed with patient? Yes  SHORT TERM GOALS: Target date: 07/20/23  Pt will complete HEP for dysphagia with rare min A Baseline: occasional min A Goal status: Partially met  2.  Pt will follow swallow precautions on trials of Dysphagia 1 and thin liquids with rare min A Baseline: did not follow precautions Goal status: MET  3.  Pt will follow diet and oral care recommendations with rare min A Baseline: occasional min A Goal status: partially met  4.  Pt will carryover compensatory strategies for dysarthria including self advocating  to successfully give his name, DOB and address to transportation dispatcher Baseline: needed A to be understood  Goal status: MET    LONG TERM GOALS: Target date: 08/31/23  Pt will complete HEP for dysphagia with mod I Baseline:  Goal status: occasional min A  2.  Pt will follow swallow precautions with mod I Baseline:  Goal status: did not follow precuations  3.  Pt will tolerate least restrictive diet following repeat swallow study Baseline:  Goal status: NPO  4.  Pt will use compensatory strategies to support intelligibility with health care providers Baseline:  Goal status: not understood by transportation   PLAN:  SLP FREQUENCY: 2x/week  SLP DURATION: 8 weeks +2 weeks for recert   PLANNED INTERVENTIONS: Aspiration precaution training, Pharyngeal strengthening exercises, Diet toleration management , Environmental controls, Trials of upgraded texture/liquids, Cueing hierachy, Internal/external aids, Functional tasks, Multimodal communication approach, and SLP instruction and feedback, MBSS    Viviana Trimble, Dareen Ebbing, CCC-SLP 08/29/2023, 2:07 PM

## 2023-08-30 NOTE — Progress Notes (Signed)
 Radiation Oncology Follow-Up Note  Patient Name: Jeff [name].  Date of Birth: Sep 28, 1960 Encounter Date: 09/07/2023  Primary Care Provider: Donald Lai, DO Referring Physician: Dr. Burnie  Assessment:   Squamous cell carcinoma of the base of tongue, stage IVA (cT3 cN2c), p16 negative, status post concurrent chemoradiotherapy.   1. Squamous cell carcinoma of base of tongue    (CMD)   2. History of radiation therapy   3. Oral pain   4. Lung nodule    Plan:   The history and plan of care for Jeff Lorenzo Ciavarella Jr. has been discussed with Dr. Blake.   His post treatment PET scan was performed on 05/11/2023 revealing marked decrease in size and metabolic activity of the mass centered at the base of tongue.  There was mild residual metabolic activity in the left tongue base and lingula tonsil.  There was interval resolution of bilateral hypermetabolic level 2 lymph nodes.  There was noted to be new hypermetabolic right upper lobe pulmonary nodule measuring 8 mm and concerning for solitary metastatic pulmonary disease.  There was no evidence of metastatic disease in the abdomen or pelvis.  His case was discussed at thoracic tumor conference and right upper lobe was not amenable to IR biopsy nor bronchoscopy due to size and location and recommendation was to repeat surveillance imaging again in 3 months.  Follow-up chest CT was performed on 08/05/2023 demonstrating increase in size of irregular/spiculated right upper lobe nodule measuring 13 mm previously 8 mm) and suspicious for primary bronchogenic carcinoma versus isolated pulmonary metastasis status.  Focal patchy opacities inferiorly in the lobe and right lower lobe, suspicious for infection/pneumonia.  He was placed on antibiotic therapy for 10 days due to symptoms of productive cough and congestion.  His imaging was presented at thoracic tumor conference to rediscuss possibility of biopsy as nodule has progressed.   Recommendation is to proceed with IR biopsy.  Biopsy is scheduled for 09/20/2023.  He will return to the office for reconsultation appointment approximately 1 week after biopsy to review pathology as well as plan of care.   In the interval, he has seen speech therapy at [facility].  He was also referred to pain management due to ongoing oropharyngeal pain posttreatment.  He was seen by their office 08/02/2023 and will continue to follow with their office for ongoing pain management. He will no longer receive prescription pain management through our office. We will provide him with gabapentin and refill was sent into his pharmacy of choice earlier this week. He does voice it is expensive for him to see pain management but he was advised to continue with current treatment plan. He also picked up Tylenol  to begin taking for pain control. He is scheduled for procedure on 09/30/2023 for left-sided ganglion block for ongoing pain.    He is also seeing speech therapy through [facility] for which he was encouraged to continue to assist with slowly advancing his diet and management of dysphagia.   He will continue to follow up with Dr. Fernand in medical oncology as scheduled for 09/13/2023. Additionally, will see Dr. Burnie in ENT on 12/22/2023.  Radiation Summary:   Radiation Treatments     Active  No active radiation treatments to show.   Historical  1 Head&Neck (Started on 12/28/2022)  Most recent fraction: 200 cGy given on 02/15/2023  Total given: 7,000 cGy / 7,000 cGy  (35 of 35 fractions)  Elapsed Days: 49  Technique: VMAT  Tolerance to Treatment:  The patient experienced thick secretions which he could not clear with swallowing.  He spit the thick secretions and did not take anything by mouth during a portion of his treatment course.  He continued all nutrition through the feeding tube. He experienced mucositis of the oral cavity and oropharynx.    Pain management:  Reviewed and no  changes recommended.   Treatment Completion/Course: Completed radiotherapy as planned.    Systemic Therapy:   Treatment Details  Treatment goal Control  Plan Name CISplatin  with Radiation Every Week - Head and Neck  Status Active  Start Date 12/28/2022  End Date 02/25/2023 (Planned)  Provider [name] MARLA Bathe, MD  Chemotherapy CISplatin  (PLATINOL ) 54 mg in sodium chloride  0.9 % chemo IVPB, 40 mg/m2 = 54 mg, intravenous Administration: 54 mg (12/28/2022), 54 mg (01/05/2023), 58 mg (01/28/2023), 58 mg (02/11/2023), 58 mg (01/12/2023)    Interval History:   Jeff [name]. returns to the office today for scheduled 3 month follow up visit.   He is doing fairly well today. He saw ENT earlier this month. He is nervous about upcoming lung biopsy and is aware he will need transportation and he is attempting to arrange this. He saw our clinic dietician prior to his appointment today. He remains with tube feedings as well as soft food by mouth. He is seeing speech therapy through [facility] for dysphagia and swallowing assistance. He was advised to continue this. He tells me he picked up Tylenol  at the store to begin taking. He has been seeing pain management due to ongoing pain post treatment. He is scheduled for procedure in the upcoming weeks to hopefully assist with symptoms. He states it is expensive for him to see their clinic but he understands we will no longer be providing him prescription pain medication. He does take Gabapentin. He denies new dysphagia or odynophagia. No new neck lymphadenopathy or other new reported symptoms.   Review of Systems:   All other systems are negative unless reported above.   Medications:    Current Outpatient Medications:  .  diphenhydramine -maalox-lidocaine -nystatin (LIDOCAINE  MOUTHWASH) 1:1:1:1 susp suspension, Take 30 mL by mouth every 4 (four) hours as needed for sore throat., Disp: , Rfl:  .  gabapentin (NEURONTIN) 300 mg capsule, Take 1  capsule (300 mg total) by mouth 3 (three) times a day., Disp: 270 capsule, Rfl: 1 .  loratadine (CLARITIN) 5 mg/5 mL solution, 10 mg by J-tube route daily., Disp: , Rfl:  .  naloxone (NARCAN) 4 mg/actuation spry nasal spray, Administer 1 spray into affected nostril(s) as needed (Use spray if symptoms of respiratory depression after oxycodone  use)., Disp: 2 each, Rfl: 0 .  ondansetron  (ZOFRAN -ODT) 8 mg disintegrating tablet, Dissolve 1 tablet (8 mg total) by mouth every 8 hours as needed (chemo induced nausea/vomiting), Disp: 30 tablet, Rfl: 3 .  oxyCODONE -acetaminophen  (PERCOCET) 10-325 mg per tablet, Take 1 tablet by mouth daily as needed for moderate pain (4-6) or severe pain (7-10)., Disp: 30 tablet, Rfl: 0 .  potassium chloride  20 mEq/15 mL solution, Take 15 mL (20 mEq total) by mouth daily for 3 days., Disp: 45 mL, Rfl: 0 .  prochlorperazine (COMPAZINE) 10 mg tablet, Take 1 tablet (10 mg total) by mouth every 6 (six) hours as needed (chemo induced nausea/vomiting), Disp: 30 tablet, Rfl: 3 .  triamcinolone acetonide (KENALOG) 0.1 % ointment, , Disp: , Rfl:  .  [facility] magic mouthwash-lidocaine  2% 1:1, Take 10 mL by mouth 4 (four) times  a day., Disp: 480 mL, Rfl: 1 .  [facility] magic mouthwash-lidocaine  2% 1:1, Take 30 mL by mouth 4 (four) times a day as needed (oral pain)., Disp: 240 mL, Rfl: 3 .  [facility] magic mouthwash-lidocaine  2% 1:1, Swish and swallow 30 mL 4 (four) times a day as needed (oral pain)., Disp: 720 mL, Rfl: 3 .  [facility] magic mouthwash, Swish and swallow 15 mL 4 (four) times a day as needed (for mouth and throat pain)., Disp: 240 mL, Rfl: 1   Allergies:   Patient has no known allergies.   Physical Exam:   Vitals:   09/07/23 1402  BP: 104/71  BP Location: Left arm  Patient Position: Sitting  Pulse: 74  Resp: 16  Temp: 97.5 F (36.4 C)  TempSrc: Oral  SpO2: 100%  Weight: 43.1 kg (95 lb)     Wt Readings from Last 3 Encounters:  09/07/23 43.1 kg (95 lb)  09/02/23 52.6 kg (116  lb)  08/22/23 42.9 kg (94 lb 8 oz)     PHYSICAL EXAMINATION   ECOG/Zubrod Performance Status: 1 - Strenous physical activity restricted; fully ambulatory and able to carry out light work KPS (80-70%)  EAT-10 Score  27 out of 40  (Subjective Eating Assessment Tool)  Constitutional  Thin African American male seated on the exam chair upon entrance to the room.  He is not in acute distress.  He looks well today.   Head & Face  Normocephalic and atraumatic.  Eyes  Sclerae anicteric. Pupils are equal, round. Extraocular  movements are intact.  ENT  Secretions remain slightly thick and pooling in his mouth at times. Tongue is mobile and protrudes to the midline.  Bilateral neck is without palpable lymphadenopathy.  Skin is well-healed without breakdown. Mild submental edema. Oropharynx is without evidence of recurrence.   Cardiology  Heart is regular in rate and rhythm.  Pulmonology  Clear to ascultation bilaterally.  Abdominal   Feeding tube in place.  Skin  Exam is without rash, bruise or petechiae noted throughout.  Musculoskeletal  No cyanosis, clubbing or edema.  Neurological  Alert and oriented to person, place and time.    Labs:   Lab Results  Component Value Date   WBC 2.11 (L) 02/17/2023   HGB 8.7 (L) 02/17/2023   HCT 26.1 (L) 02/17/2023   MCV 93.6 02/17/2023   PLT 317 02/17/2023    Lab Results  Component Value Date   NA 135 (L) 02/17/2023   K 4.0 02/17/2023   CL 100 02/17/2023   CO2 32 (H) 02/17/2023    Radiology:   EXAM: 08/08/2023 CT Chest WO Contrast Narrative: EXAM: CT CHEST WITHOUT CONTRAST 08/05/2023 02:41:00 PM  TECHNIQUE: CT of the chest was performed without the administration of intravenous contrast. Multiplanar reformatted images are provided for review. Automated exposure control, iterative reconstruction, and/or weight based adjustment of the mA/kV was utilized to reduce the radiation dose to as low as reasonably achievable.  COMPARISON: PET/CT  dated 05/11/2023.  CLINICAL HISTORY: History of base of tongue cancer, 8 mm lung nodule on PET, reimaging, malignant neoplasm of base of tongue.  FINDINGS:  MEDIASTINUM: Heart and pericardium are unremarkable. Right chest port terminating at the cavoatrial junction. Mild thoracic aortic atherosclerosis.  LYMPH NODES: No mediastinal, hilar or axillary lymphadenopathy.  LUNGS AND PLEURA: 13 mm irregular / spiculated right upper lobe nodule, previously 8 mm, suspicious for primary bronchogenic carcinoma versus isolated pulmonary metastasis. Radiation changes in the anterior upper lobes (image 19). Mild central  lobular and paraseptal emphysematous changes, upper lung predominant. Multifocal patchy opacities inferiorly in the right middle lobe and right lower lobe (images 86 and 92) suspicious for infection / pneumonia. No pleural effusion or pneumothorax.  SOFT TISSUES/BONES: No acute abnormality of the bones or soft tissues.  UPPER ABDOMEN: Percutaneous gastrostomy in satisfactory position. Atherosclerotic calcifications of the abdominal aorta. Impression: 1. 13 mm irregular/spiculated right upper lobe nodule, progressive, suspicious for primary bronchogenic carcinoma versus isolated pulmonary metastasis. 2. Multifocal patchy opacities inferiorly in the right middle lobe and right lower lobe, suspicious for infection/pneumonia.  Electronically signed by: Pinkie Pebbles MD 08/16/2023 10:25 PM EDT RP Workstation: [id number]    EXAM: 05/11/2023 NUCLEAR MEDICINE PET SKULL BASE TO THIGH   TECHNIQUE: mCi F-18 FDG was injected intravenously. Full-ring PET imaging was performed from the skull base to thigh after the radiotracer. CT data was obtained and used for attenuation correction and anatomic localization.   Fasting blood glucose:  mg/dl   COMPARISON:  None Available.   FINDINGS: NECK: Marked decrease in size and metabolic activity of the mass centered at the base of  tongue on comparison PET-CT scan. Thereis mild residual metabolic activity at the LEFT base of tongue and lingual tonsil with SUV max equal 4.7 (image 39). This is markedly decreased from the large intense mass involving posterior oropharynx with SUV max equal 20.2.   Interval resolution of the bilateral hypermetabolic level 2 lymph nodes.   Incidental CT findings: None.   CHEST: RIGHT upper lobe nodule measuring 8 mm is new from comparison exam. This nodule has intense metabolic activity for size with SUV max equal 4.1 (image 75)   Within the RIGHT lower lobe there is subtle peribronchovascular nodular thickening (image 98/series 4 in a segmental pattern. No significant metabolic activity.   Incidental CT findings: None.   ABDOMEN/PELVIS: No abnormal metabolic activity liver. No hypermetabolic abdominopelvic lymph nodes.   Inflammatory metabolic activity associated with the percutaneous gastrostomy tube. Retention bulb in expected location within the gastric lumen.   Incidental CT findings: None.   SKELETON: No focal hypermetabolic activity to suggest skeletal metastasis.   Incidental CT findings: None.   IMPRESSION: 1. Marked decrease in size and metabolic activity of the mass centered at the base of tongue. Mild residual metabolic activity at the LEFT base of tongue and lingual tonsil. 2. Interval resolution of bilateral hypermetabolic level 2 lymph nodes. 3. New hypermetabolic RIGHT upper lobe pulmonary nodule. Concern for solitary metastatic pulmonary nodule. 4. Subtle peribronchovascular nodular thickening in the RIGHT lower lobe is favored infectious or inflammatory. 5. No evidence of metastatic disease in the abdomen pelvis. 6. Peg tube in proper location.     Electronically Signed   By: Jackquline Boxer M.D.   On: 05/18/2023 08:38    Total time spent with the patient was 25 minutes; with greater than 50 percent of that time spent in face to face  counseling regarding the patient's symptoms and coordination of care and follow up.   Electronically signed by: Laneta Fleeta Elder, PA-C 09/07/2023 2:36 PM

## 2023-08-31 ENCOUNTER — Ambulatory Visit: Payer: Self-pay | Admitting: Speech Pathology

## 2023-08-31 DIAGNOSIS — R1312 Dysphagia, oropharyngeal phase: Secondary | ICD-10-CM

## 2023-08-31 DIAGNOSIS — R471 Dysarthria and anarthria: Secondary | ICD-10-CM | POA: Diagnosis not present

## 2023-08-31 NOTE — Patient Instructions (Signed)
  Keep up the swallow exercises, neck and tongue stretches going forward - radiation can make the tissue and structures in your mouth and throat hard a stiff going forward due to late effects of radiation.   Keep a log of what you are eating and how much (for example: 1/2 cup sweet potato with 1 tablespoon butter and tsp honey)  Include in the log the Ensure, juices  Keep swallowing  your spit and phlegm rather than spitting    Pain has been a big problem in improving your swallow - this needs to be addressed before you return for swallow therapy  If you continue to have swallowing difficulty, get a referral to the throat doctor Larkin Plumb)

## 2023-08-31 NOTE — Therapy (Signed)
 OUTPATIENT SPEECH LANGUAGE PATHOLOGY SWALLOW THERAPY (recert)   Patient Name: Jeff [name] MRN: [id number] DOB:08-15-1960, 63 y.o., male Today's Date: 08/31/2023  PCP: Alethia Huxley, MD REFERRING PROVIDER: Alethia Huxley, MD  END OF SESSION:  End of Session - 08/31/23 1238     Visit Number 9    Number of Visits 10    Date for SLP Re-Evaluation 08/31/23    Authorization Type medicaid - request 10 visits    Authorization Time Period per referral notes, HH used 9 visits, however was discharging so possibly 10 HH visits used - I asked FO to check    SLP Start Time 1230    SLP Stop Time  1300    SLP Time Calculation (min) 30 min    Activity Tolerance Patient tolerated treatment well           No past medical history on file. Past Surgical History:  Procedure Laterality Date   NO PAST SURGERIES     ORIF ANKLE FRACTURE Left 01/24/2015   Procedure: OPEN REDUCTION INTERNAL FIXATION (ORIF) LEFT ANKLE FRACTURE;  Surgeon: Timothy Ford, MD;  Location: [facility] OR;  Service: Orthopedics;  Laterality: Left;   Patient Active Problem List   Diagnosis Date Noted   Metabolic alkalosis 12/24/2022   Constipation 12/24/2022   Lactic acidosis 12/24/2022   Leak of percutaneous jejunostomy tube (HCC) 12/24/2022   Dehydration 12/23/2022   Iron deficiency anemia 11/13/2022   Cachexia (HCC) 11/12/2022   Severe protein-calorie malnutrition (HCC) 11/12/2022   Squamous cell carcinoma of tongue (HCC) 11/12/2022   Inadequate oral intake 11/11/2022   Squamous cell carcinoma of base of tongue (HCC) 11/10/2022   Dysphagia 09/28/2022   Smoker 09/28/2022    ONSET DATE: 05/26/2023 referral date  REFERRING DIAG: R13.10 (ICD-10-CM) - Dysphagia, unspecified  THERAPY DIAG:  Dysphagia, oropharyngeal phase  Dysarthria and anarthria  Rationale for Evaluation and Treatment: Rehabilitation  SUBJECTIVE:   SUBJECTIVE STATEMENT: Jeff [name] reports he ate almond butter and bananas on a piece of bread I was  crying, I was trying to eat more  PERTINENT HISTORY: Squamous cell carcinoma of the base of tongue, stage IVA (cT3 cN2c), p16 negative, status post concurrent chemoradiotherapy. Last chemo 02/15/23. G tube placed 11/12/22.   1. Squamous cell carcinoma of base of tongue (CMD)  2. History of radiation therapy    PAIN:  Are you having pain? Yes: NPRS scale: 10/10 Pain location: left mouth , tongue and throat Pain description: ache, sharp, HEP Aggravating factors: talking, chewing Relieving factors: gabapentin, BC powder  FALLS: Has patient fallen in last 6 months?  No  LIVING ENVIRONMENT: Lives with: lives alone Lives in: House/apartment  PLOF:  Level of assistance: Independent with ADLs Employment: On disability  PATIENT GOALS: To eat again  OBJECTIVE:  Note: Objective measures were completed at Evaluation unless otherwise noted. OBJECTIVE:   DIAGNOSTIC FINDINGS:   INSTRUMENTAL SWALLOW STUDY FINDINGS (MBSS) 05/17/23  Objective swallow impairments: Severe oropharyngeal dysphagia characterized by consistent penetration during swallow, poor UES function and pharyngeal weakness leading to regurgitation into hypopharynx with spillover into laryngeal vestibule and trickle down aspiration. Copious amounts of secretions during evaluation- patient demonstrating moderate amount of difficulty/effort for expectorating/clearing. Intact sensorium, spontaneous, reflexive throat clear and cough response to penetration/aspiration events, effective with ejection of penetrated material. Intact labial seal with no anterior spillage. Patient edentulous and unable to masticate. Poor lingual manipulation and A-P transit. Poor oral containment with premature spillage to the pyriform sinuses. Noted BOT atrophy with decreased amount of  tissue, along with concerns for shortened epiglottis. Decreased laryngeal elevation, nearly absent hyoid excursion. Patient able to initially compensate and complete closure of  laryngeal vestibule during the swallow; patient with decreased pharyngeal constriction and significant UES dysfunction leading to impeding bolus passage and retrograde movement into the hypopharynx, resulting in spillover into laryngeal vestibule and intermittent trickle down aspiration. Intact sensorium with immediate, spontaneous cough and throat clear response. Grossly effective for ejection of penetrated material but not all aspirated material. Chin tuck implemented with improved closure of laryngeal vestibule, but due to UES dysfunction and regurgitation into hypopharynx, continued trickle down penetration/aspiration occurred post-swallow. With pureed texture (applesauce), pt was unable to swallow initial bolus due to xerostomia, patient reported need to expectorate. SLP mixed thin barium liquid into applesauce/barium, with improved transport into pharynx, but mod-severe difficulty with swallow completion and passage through UES. Pt given multiple large bolus thin liquid washes after pureed textures, with intermittent penetration to variable depths, but no visualized aspiration.   Objective recommended compensations: RECOMMENDATIONS Diet: NPO Aspiration precautions:  sit upright during all PO (as close to 90 degrees as possible) only feed when awake/alert Clear  Medications: via alternate means  COGNITION: Overall cognitive status: Within functional limits for tasks assessed  SUBJECTIVE DYSPHAGIA REPORTS:  Date of onset: G tube placed 11/12/22 Reported symptoms: odynophagia, globus sensation, xerostomia, and thick secretions and phlegm  Current diet: thin liquids and PEG TF  Co-morbid voice changes: No  FACTORS WHICH MAY INCREASE RISK OF ADVERSE EVENT IN PRESENCE OF ASPIRATION:  General health: frail or deconditioned  Risk factors: weak cough and tube present (PEG)     ORAL MOTOR EXAMINATION: Overall status: Impaired:   Labial: Bilateral (ROM and Coordination) Lingual: Bilateral (ROM,  Symmetry, Strength, and Coordination) Comments:   CLINICAL SWALLOW ASSESSMENT:   Dentition: edentulous Vocal quality at baseline: normal Patient directly observed with POs: Yes: dysphagia 1 (puree) and thin liquids  Feeding: able to feed self Liquids provided by: cup [facility] Swallow Protocol: N/A see MBSS Oral phase signs and symptoms: none with puree or thin liquids Pharyngeal phase signs and symptoms: suspected delayed swallow initiation, multiple swallows, wet vocal quality, immediate throat clear, immediate cough, delayed cough, complaints of residue, and complaints of globus                                                                                                                         TREATMENT DATE:   08/31/23: Today,  Jeff [name] reports less pain - he tolerates puree and thin liquid following swallow precautions with mod I. Rare min verbal cues to swallow repeatedly rather than expectorating. Frequent belching after PO today. Mild change in voice quality after PO. HEP for dysphagia completed with mod I. EMST completed with mod I 5 sets of 5 reps. No change in settings on EMST today. He completed effortful swallow 15x. He is carrying over strategies of increased vocal intensity, over articulation and reports everybody is understanding me fine Reviewed softer foods he  can continue add to his diet.   08/29/23: Dysphagia: Jeff [name] has increased his PO intake and is working on swallowing instead of expectorating - He completed HEP with rare min A for effortful swallow with liquid sips with cues to swallow hard and fast - 5 reps with thin juice sips, 4 reps of pudding each sip followed by multiple swallow to clear pharynx. He completed lingual stretch 3x with 5 second holds - after 3 reps, stopped due to c/o pain. He forgot EMST, but is completing this at home consistently. PO trials and HEP limited due to sharp pain in tongue and throat. Ongoing cues to swallow secretions rather than expectorate.  Pain affecting progress in ST - today Jeff [name] became tearful due to pain affecting his ability to eat and complete his exercises. BC does help his pain, however he has not taken this today. I encourage him to take pain meds prior to MBSS. Tearfulness continued, therapy deferred due to pain. He states when he doesn't have pain, he completes HEP and uses speech strategies. Today he relays that speech is making his pain worse. I encourage his to get referral to laryngologist pending results of MBSS this week.      DYSPHAGIA TREATMENT:   Patient directly observed with POs: Yes: thin liquids  Liquids provided by: cup Oral phase signs and symptoms: anterior loss/spillage Pharyngeal phase signs and symptoms: multiple swallows, immediate throat clear, and delayed cough with expectoration  Therapeutic exercises:  Lingual stretching exercises- active and assisted. Pt completed anterior and anterolateral extensions with 5 second hold x5. Pt noting sensation of stretch at base of tongue throughout. Pt then completed same stretches with use of digital assist to enhance ROM, each direction x5. EMST: did not address this date Effortful swallow with puree bolus: SLP cues for bolus hold and hard/fast swallow. Pt completes x5 repetitions with thin liquid, with x2 or more subsequent dry swallows to aid in pharyngeal clearance. Pt reporting challenges in clearing applesauce from pharnyx. Attributes to being grainy. Improved success with use of pudding.  Treatment comments: Pt reports desire for full PO diet, discussed increasing intake slowly, monitoring for weight gain. Encouraged collaboration with RD to address adequate nutrients and calories. Discussed repeat MBSS, SLP to place order.   DYSARTHRIA TREATMENT:  Addressed dysarthria compensations during sentence level reading task with supervision cues following review of strategies. Pt with 90% accuracy. At generative phrase level, pt with 100% accuracy. Pt uses slowed  rate and over articulation consistently during structured practice. Ongoing cues needed to carryover to conversational speech. Pt resistant to SLP feedback on increased rate in conversational speech. Pt attempting to expectorate spit, citing basis for decreased intelligibility. SLP cues for swallowing saliva. Discussed using water to thin thick secretions. Pt reports no issues with communication with transportation agency scheduling d/t speech.   PATIENT EDUCATION: Education details: See Treatment, See Patient Instructions; swallow precautions, diet modifications, oral care, HEP for dysphagia; dysarthria strategies Person educated: Patient Education method: Explanation, Demonstration, Verbal cues, and Handouts Education comprehension: verbalized understanding, returned demonstration, verbal cues required, and needs further education   ASSESSMENT:  CLINICAL IMPRESSION: Patient is a 63 y.o. male who was seen today for oropharyngeal dysphagia s/p chemo and XRT for SCC base of tongue. Jeff [name] is managing his secretions without expectorating.Jeff [name] He continues take some PO, however pain has been an issue in his ability to tolerate PO consistently.  Jeff [name] remains on PEG TF to supplement PO. He is following HEP with mod I, following swallow  precautions with mod I.  Plan to complete repeat MBSS to assess current functional status. Goals met, d/c ST at this time. If no improvement on MBSS consider referral to laryngologist.   OBJECTIVE IMPAIRMENTS: include dysarthria and dysphagia. These impairments are limiting patient from effectively communicating at home and in community and safety when swallowing. Factors affecting potential to achieve goals and functional outcome are medical prognosis. Patient will benefit from skilled SLP services to address above impairments and improve overall function.  REHAB POTENTIAL: Fair due to h/o XRT, SCC tongue   GOALS: Goals reviewed with patient? Yes  SHORT TERM GOALS:  Target date: 07/20/23  Pt will complete HEP for dysphagia with rare min A Baseline: occasional min A Goal status: Partially met  2.  Pt will follow swallow precautions on trials of Dysphagia 1 and thin liquids with rare min A Baseline: did not follow precautions Goal status: MET  3.  Pt will follow diet and oral care recommendations with rare min A Baseline: occasional min A Goal status: partially met  4.  Pt will carryover compensatory strategies for dysarthria including self advocating to successfully give his name, DOB and address to transportation dispatcher Baseline: needed A to be understood  Goal status: MET    LONG TERM GOALS: Target date: 08/31/23  Pt will complete HEP for dysphagia with mod I Baseline:  Goal status: MET  2.  Pt will follow swallow precautions with mod I Baseline:  Goal status: MET  3.  Pt will tolerate least restrictive diet following repeat swallow study Baseline: NPO Goal status: MBSS is tomorrow  4.  Pt will use compensatory strategies to support intelligibility with health care providers Baseline:  Goal status: MET PLAN:  SLP FREQUENCY: 2x/week  SLP DURATION: 8 weeks +2 weeks for recert   PLANNED INTERVENTIONS: Aspiration precaution training, Pharyngeal strengthening exercises, Diet toleration management , Environmental controls, Trials of upgraded texture/liquids, Cueing hierachy, Internal/external aids, Functional tasks, Multimodal communication approach, and SLP instruction and feedback, MBSS  SPEECH THERAPY DISCHARGE SUMMARY  Visits from Start of Care: 9  Current functional level related to goals / functional outcomes: See goals above   Remaining deficits: Oropharyngeal dysphagia, PEG TF   Education / Equipment: HEP for dysphagia, swallow precautions, diet modifications, compensatory strategies for intelligibility.   Patient agrees to discharge. Patient goals were met. Patient is being discharged due to meeting the stated rehab  goals..     Gini Caputo Ann, CCC-SLP 08/31/2023, 1:19 PM

## 2023-09-01 ENCOUNTER — Ambulatory Visit (HOSPITAL_COMMUNITY)
Admission: RE | Admit: 2023-09-01 | Discharge: 2023-09-01 | Disposition: A | Discharge status: Home / Self Care | Source: Ambulatory Visit | Attending: Oncology | Admitting: Oncology

## 2023-09-01 DIAGNOSIS — Z7689 Persons encountering health services in other specified circumstances: Secondary | ICD-10-CM | POA: Diagnosis not present

## 2023-09-01 DIAGNOSIS — R1310 Dysphagia, unspecified: Secondary | ICD-10-CM | POA: Diagnosis present

## 2023-09-01 DIAGNOSIS — R471 Dysarthria and anarthria: Secondary | ICD-10-CM

## 2023-09-01 DIAGNOSIS — R1312 Dysphagia, oropharyngeal phase: Secondary | ICD-10-CM | POA: Insufficient documentation

## 2023-09-01 DIAGNOSIS — C01 Malignant neoplasm of base of tongue: Secondary | ICD-10-CM | POA: Diagnosis not present

## 2023-09-01 NOTE — Progress Notes (Signed)
 Modified Barium Swallow Study  Patient Details  Name: Jeff [name] MRN: [id number] Date of Birth: Mar 17, 1960  Today's Date: 09/01/2023  Modified Barium Swallow completed.  Full report located under Chart Review in the Imaging Section.  History of Present Illness Squamous cell carcinoma of the base of tongue, stage IVA (cT3 cN2c), p16 negative, status post concurrent chemoradiotherapy. Last chemo 02/15/23. G tube placed 11/12/22. OPST from April to June targeting oropharyngeal dysphagia with limitations d/t pain. Pt has advanced solid intake though ongoing dysphagia is demonstrated and reported.   Clinical Impression MBS completed in the lateral projection to evaluation swallow function and safety in the setting of post-radiation dysphagia. Pt presents with severe oropharyngeal dysphagia (DIGEST score of 3). He consistently demonstrates aspiration, which primary occurs after the swallow due to pharyngeal residue which is unable to be cleared with multiple swallows. Pt's oral phase of swallow is c/b intact lingual coordination for directed bolus hold but impairments evidenced with a/p transit attempts. Pt demonstrates lingual rocking and post-swallow, diffuse oral residue. Pharyngeal swallow is delayed, with swallow trigger demonstrated with majority of bolus at level of pyriform sinuses, enabling early airway invasion. At this point, material remains high in laryngeal vestibule and majority of penetrated material is ejected during swallow. Trace amount that remains does result in trickle down aspiration. During the swallow, pt with impaired epiglottic deflection and reduced laryngeal elevation and anterior hyoid excursion. Primary cause of aspiration appears to be pharyngeal residue as pt is unable to clear entirety of bolus through the UES. Pharyngeal residue is diffuse and d/t reduced base of tongue retraction and pharyngeal contraction. Pt demonstrates only intermittent sensation to aspiration and  pharyngeal residue. Cues for throat clears is effective for removing aspirate material from vocal folds or above, cued cough is ineffective for clearing eventual aspiration below vocal folds. For pudding texture, pt with increased residue which he is unable to generate sufficient pressure to move completely through the pharynx. Majority of material remains at level of valleculae and pt needs to expectorate to remove. Did not attempt solids or pill administration. Pt is suggested to continue working on swallow exercises and supplementing tube feeds with thin purees and thin liquids with strict aspiration precautions and ongoing monitoring of respiratory status. I do not think pt will be able to fully meet nutritional needs via PO at this time d/t severity of deficits and pt reported pain with swallowing. Pt may wish to consider ENT evaluation for reports of chronic and severe throat pain. Pt should continue with swallow HEP and will likely need return to OPST in about 6 months. Factors that may increase risk of adverse event in presence of aspiration Roderick Civatte & Jessy Morocco 2021): Poor general health and/or compromised immunity;Frail or deconditioned;Presence of tubes (ETT, trach, NG, etc.);Aspiration of thick, dense, and/or acidic materials  Swallow Evaluation Recommendations Recommendations: PO diet;Alternative means of nutrition - G Tube PO Diet Recommendation: Dysphagia 1 (Pureed);Thin liquids (Level 0) (pleasure feeding) Medication Administration: Via alternative means Swallowing strategies  : Small bites/sips;effortful swallow;Multiple dry swallows after each bite/sip;Hard cough after swallowing;Clear throat intermittently Oral care recommendations: Oral care QID (4x/day);Oral care before PO Recommended consults: Consider ENT consultation      Alston Jerry 09/01/2023,1:01 PM

## 2023-09-02 DIAGNOSIS — R519 Headache, unspecified: Secondary | ICD-10-CM | POA: Diagnosis not present

## 2023-09-02 DIAGNOSIS — C01 Malignant neoplasm of base of tongue: Secondary | ICD-10-CM | POA: Diagnosis not present

## 2023-09-02 DIAGNOSIS — G894 Chronic pain syndrome: Secondary | ICD-10-CM | POA: Diagnosis not present

## 2023-09-02 DIAGNOSIS — C029 Malignant neoplasm of tongue, unspecified: Secondary | ICD-10-CM | POA: Diagnosis not present

## 2023-09-02 DIAGNOSIS — Z7689 Persons encountering health services in other specified circumstances: Secondary | ICD-10-CM | POA: Diagnosis not present

## 2023-09-02 DIAGNOSIS — Z85810 Personal history of malignant neoplasm of tongue: Secondary | ICD-10-CM | POA: Diagnosis not present

## 2023-09-05 ENCOUNTER — Encounter: Payer: Self-pay | Admitting: Speech Pathology

## 2023-09-07 DIAGNOSIS — C01 Malignant neoplasm of base of tongue: Secondary | ICD-10-CM | POA: Diagnosis not present

## 2023-09-07 DIAGNOSIS — Z923 Personal history of irradiation: Secondary | ICD-10-CM | POA: Diagnosis not present

## 2023-09-07 DIAGNOSIS — Z7689 Persons encountering health services in other specified circumstances: Secondary | ICD-10-CM | POA: Diagnosis not present

## 2023-09-07 DIAGNOSIS — K1379 Other lesions of oral mucosa: Secondary | ICD-10-CM | POA: Diagnosis not present

## 2023-09-07 DIAGNOSIS — R911 Solitary pulmonary nodule: Secondary | ICD-10-CM | POA: Diagnosis not present

## 2023-09-13 ENCOUNTER — Inpatient Hospital Stay (HOSPITAL_COMMUNITY)
Admission: EM | Admit: 2023-09-13 | Discharge: 2023-09-18 | DRG: 643 | Disposition: A | Discharge status: Home / Self Care | Source: Ambulatory Visit | Attending: Internal Medicine | Admitting: Internal Medicine

## 2023-09-13 ENCOUNTER — Encounter (HOSPITAL_COMMUNITY): Payer: Self-pay

## 2023-09-13 ENCOUNTER — Other Ambulatory Visit: Payer: Self-pay

## 2023-09-13 DIAGNOSIS — E861 Hypovolemia: Secondary | ICD-10-CM | POA: Diagnosis present

## 2023-09-13 DIAGNOSIS — E222 Syndrome of inappropriate secretion of antidiuretic hormone: Principal | ICD-10-CM | POA: Diagnosis present

## 2023-09-13 DIAGNOSIS — Z7689 Persons encountering health services in other specified circumstances: Secondary | ICD-10-CM | POA: Diagnosis not present

## 2023-09-13 DIAGNOSIS — D630 Anemia in neoplastic disease: Secondary | ICD-10-CM | POA: Diagnosis present

## 2023-09-13 DIAGNOSIS — R64 Cachexia: Secondary | ICD-10-CM | POA: Diagnosis present

## 2023-09-13 DIAGNOSIS — C01 Malignant neoplasm of base of tongue: Secondary | ICD-10-CM | POA: Diagnosis not present

## 2023-09-13 DIAGNOSIS — Z923 Personal history of irradiation: Secondary | ICD-10-CM

## 2023-09-13 DIAGNOSIS — D72819 Decreased white blood cell count, unspecified: Secondary | ICD-10-CM | POA: Diagnosis present

## 2023-09-13 DIAGNOSIS — Z85810 Personal history of malignant neoplasm of tongue: Secondary | ICD-10-CM

## 2023-09-13 DIAGNOSIS — Z9221 Personal history of antineoplastic chemotherapy: Secondary | ICD-10-CM

## 2023-09-13 DIAGNOSIS — E871 Hypo-osmolality and hyponatremia: Principal | ICD-10-CM | POA: Diagnosis present

## 2023-09-13 DIAGNOSIS — R471 Dysarthria and anarthria: Secondary | ICD-10-CM | POA: Diagnosis present

## 2023-09-13 DIAGNOSIS — Z931 Gastrostomy status: Secondary | ICD-10-CM

## 2023-09-13 DIAGNOSIS — D649 Anemia, unspecified: Secondary | ICD-10-CM | POA: Diagnosis not present

## 2023-09-13 DIAGNOSIS — Z79899 Other long term (current) drug therapy: Secondary | ICD-10-CM

## 2023-09-13 DIAGNOSIS — Z87891 Personal history of nicotine dependence: Secondary | ICD-10-CM

## 2023-09-13 DIAGNOSIS — E43 Unspecified severe protein-calorie malnutrition: Secondary | ICD-10-CM | POA: Diagnosis present

## 2023-09-13 DIAGNOSIS — R911 Solitary pulmonary nodule: Secondary | ICD-10-CM | POA: Diagnosis present

## 2023-09-13 DIAGNOSIS — Z681 Body mass index (BMI) 19 or less, adult: Secondary | ICD-10-CM

## 2023-09-13 DIAGNOSIS — C029 Malignant neoplasm of tongue, unspecified: Secondary | ICD-10-CM | POA: Diagnosis present

## 2023-09-13 HISTORY — DX: Malignant (primary) neoplasm, unspecified: C80.1

## 2023-09-13 LAB — COMPREHENSIVE METABOLIC PANEL WITH GFR
ALT: 8 U/L (ref 0–44)
AST: 20 U/L (ref 15–41)
Albumin: 3.7 g/dL (ref 3.5–5.0)
Alkaline Phosphatase: 71 U/L (ref 38–126)
Anion gap: 9 (ref 5–15)
BUN: 9 mg/dL (ref 8–23)
CO2: 24 mmol/L (ref 22–32)
Calcium: 9.7 mg/dL (ref 8.9–10.3)
Chloride: 91 mmol/L — ABNORMAL LOW (ref 98–111)
Creatinine, Ser: 0.72 mg/dL (ref 0.61–1.24)
GFR, Estimated: >60 mL/min (ref >60)
Glucose, Bld: 90 mg/dL (ref 70–99)
Potassium: 4.3 mmol/L (ref 3.5–5.1)
Sodium: 124 mmol/L — ABNORMAL LOW (ref 135–145)
Total Bilirubin: 0.5 mg/dL (ref 0.0–1.2)
Total Protein: 7.7 g/dL (ref 6.5–8.1)

## 2023-09-13 LAB — CBC WITH DIFFERENTIAL/PLATELET
Abs Immature Granulocytes: 0.01 10*3/uL (ref 0.00–0.07)
Basophils Absolute: 0.1 10*3/uL (ref 0.0–0.1)
Basophils Relative: 2 %
Eosinophils Absolute: 0 10*3/uL (ref 0.0–0.5)
Eosinophils Relative: 1 %
HCT: 30.4 % — ABNORMAL LOW (ref 39.0–52.0)
Hemoglobin: 10.4 g/dL — ABNORMAL LOW (ref 13.0–17.0)
Immature Granulocytes: 0 %
Lymphocytes Relative: 21 %
Lymphs Abs: 0.7 10*3/uL (ref 0.7–4.0)
MCH: 28.6 pg (ref 26.0–34.0)
MCHC: 34.2 g/dL (ref 30.0–36.0)
MCV: 83.5 fL (ref 80.0–100.0)
Monocytes Absolute: 0.5 10*3/uL (ref 0.1–1.0)
Monocytes Relative: 17 %
Neutro Abs: 1.9 10*3/uL (ref 1.7–7.7)
Neutrophils Relative %: 59 %
Platelets: 270 10*3/uL (ref 150–400)
RBC: 3.64 MIL/uL — ABNORMAL LOW (ref 4.22–5.81)
RDW: 16.7 % — ABNORMAL HIGH (ref 11.5–15.5)
WBC: 3.1 10*3/uL — ABNORMAL LOW (ref 4.0–10.5)
nRBC: 0 % (ref 0.0–0.2)

## 2023-09-13 LAB — MAGNESIUM: Magnesium: 1.8 mg/dL (ref 1.7–2.4)

## 2023-09-13 NOTE — ED Notes (Signed)
 Pt stepping outside to warm up. Pt refused blanket.

## 2023-09-13 NOTE — ED Provider Triage Note (Signed)
 Emergency Medicine Provider Triage Evaluation Note  Jeff [name] , a 63 y.o. male  was evaluated in triage.  Pt complains of hyponatremia 123.  Has a history of squamous cell carcinoma.  Review of Systems  Positive:  Negative:   Physical Exam  BP 120/89   Pulse 92   Temp 98.9 F (37.2 C)   Resp 14   Ht 5' 9 (1.753 m)   Wt 47.6 kg   SpO2 98%   BMI 15.51 kg/m  Gen:   Awake, no distress   Resp:  Normal effort  MSK:   Moves extremities without difficulty  Other:  Euvolemic  Medical Decision Making  Medically screening exam initiated at 8:25 PM.  Appropriate orders placed.  Jeff [name] was informed that the remainder of the evaluation will be completed by another provider, this initial triage assessment does not replace that evaluation, and the importance of remaining in the ED until their evaluation is complete.  Patient is alert and oriented, mentation at baseline, appears euvolemic, vitals are stable, will repeat basic labs   Jeff [name] 09/13/23 2028

## 2023-09-14 ENCOUNTER — Emergency Department (HOSPITAL_COMMUNITY)

## 2023-09-14 ENCOUNTER — Encounter (HOSPITAL_COMMUNITY): Payer: Self-pay | Admitting: Internal Medicine

## 2023-09-14 DIAGNOSIS — Z85810 Personal history of malignant neoplasm of tongue: Secondary | ICD-10-CM | POA: Diagnosis not present

## 2023-09-14 DIAGNOSIS — D72819 Decreased white blood cell count, unspecified: Secondary | ICD-10-CM | POA: Diagnosis not present

## 2023-09-14 DIAGNOSIS — E222 Syndrome of inappropriate secretion of antidiuretic hormone: Secondary | ICD-10-CM | POA: Diagnosis not present

## 2023-09-14 DIAGNOSIS — R64 Cachexia: Secondary | ICD-10-CM | POA: Diagnosis not present

## 2023-09-14 DIAGNOSIS — Z923 Personal history of irradiation: Secondary | ICD-10-CM | POA: Diagnosis not present

## 2023-09-14 DIAGNOSIS — E871 Hypo-osmolality and hyponatremia: Secondary | ICD-10-CM | POA: Diagnosis not present

## 2023-09-14 DIAGNOSIS — D630 Anemia in neoplastic disease: Secondary | ICD-10-CM | POA: Diagnosis not present

## 2023-09-14 DIAGNOSIS — R471 Dysarthria and anarthria: Secondary | ICD-10-CM | POA: Diagnosis not present

## 2023-09-14 DIAGNOSIS — Z681 Body mass index (BMI) 19 or less, adult: Secondary | ICD-10-CM | POA: Diagnosis not present

## 2023-09-14 DIAGNOSIS — E861 Hypovolemia: Secondary | ICD-10-CM | POA: Diagnosis not present

## 2023-09-14 DIAGNOSIS — D649 Anemia, unspecified: Secondary | ICD-10-CM | POA: Diagnosis not present

## 2023-09-14 DIAGNOSIS — R911 Solitary pulmonary nodule: Secondary | ICD-10-CM | POA: Diagnosis not present

## 2023-09-14 DIAGNOSIS — Z9221 Personal history of antineoplastic chemotherapy: Secondary | ICD-10-CM | POA: Diagnosis not present

## 2023-09-14 DIAGNOSIS — Z931 Gastrostomy status: Secondary | ICD-10-CM | POA: Diagnosis not present

## 2023-09-14 DIAGNOSIS — Z79899 Other long term (current) drug therapy: Secondary | ICD-10-CM | POA: Diagnosis not present

## 2023-09-14 DIAGNOSIS — J4489 Other specified chronic obstructive pulmonary disease: Secondary | ICD-10-CM | POA: Diagnosis not present

## 2023-09-14 DIAGNOSIS — E43 Unspecified severe protein-calorie malnutrition: Secondary | ICD-10-CM | POA: Diagnosis not present

## 2023-09-14 DIAGNOSIS — Z87891 Personal history of nicotine dependence: Secondary | ICD-10-CM | POA: Diagnosis not present

## 2023-09-14 LAB — BASIC METABOLIC PANEL WITH GFR
Anion gap: 9 (ref 5–15)
Anion gap: 9 (ref 5–15)
BUN: 10 mg/dL (ref 8–23)
BUN: 9 mg/dL (ref 8–23)
CO2: 23 mmol/L (ref 22–32)
CO2: 23 mmol/L (ref 22–32)
Calcium: 9.1 mg/dL (ref 8.9–10.3)
Calcium: 9.2 mg/dL (ref 8.9–10.3)
Chloride: 94 mmol/L — ABNORMAL LOW (ref 98–111)
Chloride: 92 mmol/L — ABNORMAL LOW (ref 98–111)
Creatinine, Ser: 0.69 mg/dL (ref 0.61–1.24)
Creatinine, Ser: 0.73 mg/dL (ref 0.61–1.24)
GFR, Estimated: >60 mL/min (ref >60)
GFR, Estimated: >60 mL/min (ref >60)
Glucose, Bld: 72 mg/dL (ref 70–99)
Glucose, Bld: 77 mg/dL (ref 70–99)
Potassium: 4.6 mmol/L (ref 3.5–5.1)
Potassium: 4.7 mmol/L (ref 3.5–5.1)
Sodium: 126 mmol/L — ABNORMAL LOW (ref 135–145)
Sodium: 124 mmol/L — ABNORMAL LOW (ref 135–145)

## 2023-09-14 LAB — URINALYSIS, ROUTINE W REFLEX MICROSCOPIC
Bacteria, UA: NONE SEEN
Bilirubin Urine: NEGATIVE
Glucose, UA: NEGATIVE mg/dL
Hgb urine dipstick: NEGATIVE
Ketones, ur: NEGATIVE mg/dL
Leukocytes,Ua: NEGATIVE
Nitrite: NEGATIVE
Protein, ur: NEGATIVE mg/dL
Specific Gravity, Urine: 1.014 (ref 1.005–1.030)
pH: 6 (ref 5.0–8.0)

## 2023-09-14 LAB — PHOSPHORUS: Phosphorus: 3 mg/dL (ref 2.5–4.6)

## 2023-09-14 LAB — CBC
HCT: 32.9 % — ABNORMAL LOW (ref 39.0–52.0)
Hemoglobin: 11.1 g/dL — ABNORMAL LOW (ref 13.0–17.0)
MCH: 28.8 pg (ref 26.0–34.0)
MCHC: 33.7 g/dL (ref 30.0–36.0)
MCV: 85.5 fL (ref 80.0–100.0)
Platelets: 299 10*3/uL (ref 150–400)
RBC: 3.85 MIL/uL — ABNORMAL LOW (ref 4.22–5.81)
RDW: 17.2 % — ABNORMAL HIGH (ref 11.5–15.5)
WBC: 3.7 10*3/uL — ABNORMAL LOW (ref 4.0–10.5)
nRBC: 0 % (ref 0.0–0.2)

## 2023-09-14 LAB — CORTISOL: Cortisol, Plasma: 9.8 ug/dL

## 2023-09-14 LAB — MAGNESIUM: Magnesium: 2 mg/dL (ref 1.7–2.4)

## 2023-09-14 LAB — TSH: TSH: 4.997 u[IU]/mL — ABNORMAL HIGH (ref 0.350–4.500)

## 2023-09-14 MED ORDER — SODIUM CHLORIDE 0.9 % IV BOLUS
1000.0000 mL | Freq: Once | INTRAVENOUS | Status: AC
  Administered 2023-09-14: 1000 mL via INTRAVENOUS

## 2023-09-14 MED ORDER — ENOXAPARIN SODIUM 40 MG/0.4ML IJ SOSY
40.0000 mg | PREFILLED_SYRINGE | INTRAMUSCULAR | Status: DC
  Administered 2023-09-14 – 2023-09-18 (×5): 40 mg via SUBCUTANEOUS
  Filled 2023-09-14 (×5): qty 0.4

## 2023-09-14 MED ORDER — THIAMINE MONONITRATE 100 MG PO TABS
100.0000 mg | ORAL_TABLET | Freq: Every day | ORAL | Status: DC
  Administered 2023-09-14 – 2023-09-18 (×5): 100 mg
  Filled 2023-09-14 (×5): qty 1

## 2023-09-14 MED ORDER — HEPARIN SODIUM (PORCINE) 5000 UNIT/ML IJ SOLN: 5000.0000 [IU] | Freq: Three times a day (TID) | INTRAMUSCULAR | Status: DC

## 2023-09-14 MED ORDER — ACETAMINOPHEN 160 MG/5ML PO SOLN
650.0000 mg | Freq: Once | ORAL | Status: AC
  Administered 2023-09-14: 650 mg
  Filled 2023-09-14: qty 20.3

## 2023-09-14 MED ORDER — GABAPENTIN 250 MG/5ML PO SOLN: 300.0000 mg | Freq: Once | ORAL | Status: DC

## 2023-09-14 MED ORDER — ENSURE PLUS HIGH PROTEIN PO LIQD
237.0000 mL | Freq: Once | ORAL | Status: AC
  Administered 2023-09-14: 237 mL via ORAL
  Filled 2023-09-14: qty 237

## 2023-09-14 MED ORDER — GABAPENTIN 300 MG PO CAPS
300.0000 mg | ORAL_CAPSULE | Freq: Three times a day (TID) | ORAL | Status: DC
  Administered 2023-09-14 – 2023-09-18 (×12): 300 mg
  Filled 2023-09-14 (×12): qty 1

## 2023-09-14 MED ORDER — GABAPENTIN 250 MG/5ML PO SOLN
300.0000 mg | Freq: Once | ORAL | Status: AC
  Administered 2023-09-14: 300 mg
  Filled 2023-09-14: qty 6

## 2023-09-14 MED ORDER — ACETAMINOPHEN 160 MG/5ML PO SOLN
650.0000 mg | ORAL | Status: DC | PRN
  Administered 2023-09-14 – 2023-09-18 (×9): 650 mg
  Filled 2023-09-14 (×11): qty 20.3

## 2023-09-14 MED ORDER — MAGNESIUM SULFATE 2 GM/50ML IV SOLN
2.0000 g | Freq: Once | INTRAVENOUS | Status: AC
  Administered 2023-09-14: 2 g via INTRAVENOUS
  Filled 2023-09-14: qty 50

## 2023-09-14 MED ORDER — ENSURE PLUS HIGH PROTEIN PO LIQD
237.0000 mL | Freq: Two times a day (BID) | ORAL | Status: DC
  Administered 2023-09-14 – 2023-09-18 (×7): 237 mL via ORAL
  Filled 2023-09-14: qty 237

## 2023-09-14 MED ORDER — JEVITY 1.5 CAL/FIBER PO LIQD
1000.0000 mL | ORAL | Status: DC
  Administered 2023-09-14: 1000 mL
  Filled 2023-09-14 (×2): qty 1000

## 2023-09-14 MED ORDER — ACETAMINOPHEN 160 MG/5ML PO SOLN: 650.0000 mg | Freq: Once | ORAL | Status: DC

## 2023-09-14 NOTE — TOC CM/SW Note (Signed)
 Transition of Care [facility]) - Inpatient Brief Assessment   Patient Details  Name: Jeff [name] MRN: [id number] Date of Birth: 1960-08-23  Transition of Care [facility] CM/SW Contact:    Lauraine FORBES Saa, LCSW Phone Number: 09/14/2023, 10:22 AM   Clinical Narrative:  10:22 AM Per chart review, patient resides at home alone. Patient has a PCP and insurance. Patient does not have SNF history. Patient has HH history with [facility]. Patient has DME suction device for home. Patient's preferred pharmacy is [facility] [phone] [facility]. No TOC needs were identified at this time. TOC will continue to follow and be available to assist.  Transition of Care Asessment: Insurance and Status: Insurance coverage has been reviewed Patient has primary care physician: Yes Home environment has been reviewed: Private Residence Prior level of function:: N/A Prior/Current Home Services: No current home services Social Drivers of Health Review: SDOH reviewed no interventions necessary Readmission risk has been reviewed: Yes Transition of care needs: no transition of care needs at this time

## 2023-09-14 NOTE — ED Provider Notes (Signed)
 [facility] AT [facility] Provider Note   CSN: [id number] Arrival date & time: 09/13/23  1749     Patient presents with: abnormal labs (States he was sent to ED for abnormal sodium levels/Shows no signs of distress)   Korbin L Cavanah is a 63 y.o. male.   The history is provided by the patient.  She has history of squamous cell carcinoma of the base of the tongue was told to come to the emergency department because of low sodium.  He was seen at his oncologist's office today for routine follow-up and routine laboratory work showed low sodium.  Patient denies weakness, nausea, vomiting.  He does have a feeding tube but has been taking food orally as well.   Prior to Admission medications   Medication Sig Start Date End Date Taking? Authorizing Provider  gabapentin (NEURONTIN) 300 MG capsule Take 300 mg by mouth 3 (three) times daily.    [provider]  loratadine (CLARITIN) 5 MG/5ML syrup Place 10 mg into feeding tube daily.    [provider]  naloxone [facility]) nasal spray 4 mg/0.1 mL Place 1 spray into the nose as needed (respiratory depression after oxycodone  use). 12/19/22   [provider]  oxyCODONE -acetaminophen  (PERCOCET) 10-325 MG tablet Place 1 tablet into feeding tube every 6 (six) hours as needed for pain. Patient not taking: Reported on 06/22/2023 12/19/22   [provider]    Allergies: Patient has no known allergies.    Review of Systems  All other systems reviewed and are negative.   Updated Vital Signs BP 115/80 (BP Location: Right Arm)   Pulse 76   Temp 97.7 F (36.5 C)   Resp 17   Ht 5' 9 (1.753 m)   Wt 47.6 kg   SpO2 100%   BMI 15.51 kg/m   Physical Exam Vitals and nursing note reviewed.   Somewhat cachectic 63 year old male, resting comfortably and in no acute distress. Vital signs are normal. Oxygen saturation is 100%, which is normal. Head is normocephalic and atraumatic. PERRLA,  EOMI.  Neck is nontender and supple without adenopathy. Lungs are clear without rales, wheezes, or rhonchi. Chest is nontender. Heart has regular rate and rhythm without murmur. Abdomen is soft, flat, nontender.  PEG tube present in the upper abdomen. Extremities have no cyanosis or edema. Skin is warm and dry without rash. Neurologic: Awake and alert, speech somewhat dysarthric secondary to prior tongue surgery.  Moves all extremities equally.  Ambulatory with steady gait.  (all labs ordered are listed, but only abnormal results are displayed) Labs Reviewed  CBC WITH DIFFERENTIAL/PLATELET - Abnormal; Notable for the following components:      Result Value   WBC 3.1 (*)    RBC 3.64 (*)    Hemoglobin 10.4 (*)    HCT 30.4 (*)    RDW 16.7 (*)    All other components within normal limits  COMPREHENSIVE METABOLIC PANEL WITH GFR - Abnormal; Notable for the following components:   Sodium 124 (*)    Chloride 91 (*)    All other components within normal limits  MAGNESIUM  URINALYSIS, ROUTINE W REFLEX MICROSCOPIC    EKG: EKG Interpretation Date/Time:  Tuesday September 13 2023 21:02:06 EDT Ventricular Rate:  70 PR Interval:  174 QRS Duration:  84 QT Interval:  374 QTC Calculation: 403 R Axis:   75  Text Interpretation: Normal sinus rhythm Septal infarct , age undetermined Abnormal ECG When compared with ECG of  24-Dec-2022 13:25, No significant change was found Confirmed by Raford Lenis ([id number]) on 09/13/2023 11:13:43 PM  Radiology: No results found.   Procedures   Medications Ordered in the ED - No data to display                                  Medical Decision Making Amount and/or Complexity of Data Reviewed Labs: ordered. Radiology: ordered.  Risk OTC drugs. Prescription drug management.   Hyponatremia.  I have reviewed his past records, and note comprehensive metabolic panel earlier today showing sodium 123 and magnesium slightly low at 1.7.  I note that he had  completed radiation therapy and chemotherapy approximately 6 months ago.  CT of chest on 08/05/2023 showed a 13 mm right upper lobe nodule which had grown from 8 mm when seen on PET scan 05/11/2023.  I have reviewed his laboratory tests here, and my pression is sodium is essentially unchanged at 123, stable anemia, mild leukopenia.  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  Differential diagnosis also includes SIADH secondary to medications (he is on gabapentin which can cause SIADH).  Last sodium on record was on 02/17/2023 at which time it was 135.  Because of the rapidity of developing hyponatremia, I feel this is best managed as an inpatient initially.  I have ordered urine electrolytes and I have ordered a bolus of normal saline solution.  Also, since magnesium was documented to be low at the hematology/oncology clinic, I have ordered intravenous magnesium (magnesium is borderline low here).  I have discussed the case with Dr. Franky of [facility], who agrees to admit the patient.  Final diagnoses:  Hyponatremia  Normochromic normocytic anemia  Leukopenia, unspecified type  Nodule of upper lobe of right lung    ED Discharge Orders     None          Raford Lenis, MD 09/14/23 0200

## 2023-09-14 NOTE — Progress Notes (Addendum)
   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  The patient has been PEG tube dependent but over the last 2 weeks has begun oral intake supplemented with Ensure.  He denied nausea vomiting or diarrhea.  In the ER his sodium was found to be 124.  He appeared hypovolemic at presentation.  Goals of Care:   Code Status: Full Code   DVT prophylaxis: heparin injection 5,000 Units Start: 09/14/23 1400   Interim Hx: The patient was examined and interviewed by one of my partners earlier today.  Assessment & Plan:  Hypovolemic hyponatremia Sodium improving with simple volume resuscitation  Squamous cell carcinoma base of the tongue Status post chemotherapy and radiation which ended in December 2024 -followed by oncologist at [facility]  PEG tube dependent Due to above -recently began to advance oral intake  Lateral right upper lobe lung nodule Noted on CT May 2025 -appears stable on CXR this admission  Nutrition Problem: Severe Malnutrition Etiology: chronic illness (cancer and cancer related treatments) Signs/Symptoms: severe fat depletion, severe muscle depletion, energy intake < or equal to 75% for > or equal to 1 month Interventions: Tube feeding, Ensure Enlive (each supplement provides 350kcal and 20 grams of protein)    Family Communication:  Disposition:     Objective: Blood pressure (!) 121/98, pulse 80, temperature 97.8 F (36.6 C), temperature source Oral, resp. rate 18, height 5' 9 (1.753 m), weight 47.6 kg, SpO2 98%. No intake or output data in the 24 hours ending 09/14/23 0804 Filed Weights   09/13/23 2015  Weight: 47.6 kg    Examination: The patient was examined by one of my partners  earlier today.  CBC: Recent Labs  Lab 09/13/23 2027 09/14/23 0614  WBC 3.1* 3.7*  NEUTROABS 1.9  --   HGB 10.4* 11.1*  HCT 30.4* 32.9*  MCV 83.5 85.5  PLT 270 299   Basic Metabolic Panel: Recent Labs  Lab 09/13/23 2027 09/14/23 0614  NA 124* 126*  K 4.3 4.6  CL 91* 94*  CO2 24 23  GLUCOSE 90 72  BUN 9 10  CREATININE 0.72 0.69  CALCIUM  9.7 9.1  MG 1.8  --    GFR: Estimated Creatinine Clearance: 64.5 mL/min (by C-G formula based on SCr of 0.69 mg/dL).   Scheduled Meds:  gabapentin  300 mg Per Tube TID   heparin  5,000 Units Subcutaneous Q8H      LOS: 0 days   [name], MD [facility] Office  [phone] Pager - Text Page per Amion  If 7PM-7AM, please contact night-coverage per Amion 09/14/2023, 8:04 AM

## 2023-09-14 NOTE — Plan of Care (Signed)

## 2023-09-14 NOTE — Progress Notes (Signed)
 Initial Nutrition Assessment  DOCUMENTATION CODES:   Severe malnutrition in context of chronic illness, Underweight (cancer and cancer related treatments)  INTERVENTION:  Recommended SLP evaluation Recommend initiating pt's home tube feed regimen while admitted: Initiate continuous tube feeding via PEG: Jevity 1.5 at 60 ml/h (1440 ml per day)  Provides 2160 kcal, 91 gm protein, 1094 ml free water daily  Pt is at risk for refeeding syndrome given severe malnutrition and poor PO intake. Monitor magnesium and phosphorus daily x 2 days, MD to replete as needed. 100mg  thiamine  x 5 days  NUTRITION DIAGNOSIS:   Severe Malnutrition related to chronic illness (cancer and cancer related treatments) as evidenced by severe fat depletion, severe muscle depletion, energy intake < or equal to 75% for > or equal to 1 month.  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 Admitted following blood work for low sodium.  Per chart review, pt recently seen for aspiration pneumonia and was seen by SLP who diagnosed with severe oropharyngeal dysphagia and recommended NPO with monitoring for any PO intake, discussed with MD about changing pt's status while admitted to NPO and asking for a current SLP eval. Discussed initiating home regimen for tube feeds to be administered via pt's PEG, MD agreeable.  Spoke with pt who was awake and alert during assessment. Pt reports he recently had aspiration pneumonia and has been having difficulty swallowing. Pt reports PTA, he was tolerating soft foods like potatoes and soft crackers, but within the past few weeks he started  having issues with swallowing and feeling like food was getting stuck in his throat. Pt was seen by SLP 6/18 and was diagnosed with severe oropharyngeal dysphagia and recommended NPO status. Pt reports he misses eating by mouth which is why he has continued to try foods without supervision which was the SLP recommendation. Pt reports very limited intake in the last few weeks due to swallowing difficulties and did not report relying on tube feeds recently. Pt reports when he was using tube feeds, he would use a pump at home and run it continuously over 24 hour period. Suspect pt has had under nutrition chronically since he reports not being connected to tube feeds 24/7. Pt previously tried bolus feeds but did not tolerate well. Discussed importance of initiating home tube feed regimen while admitted to help sodium levels and help meet his calorie and protein needs. Consulted oncology dietitian's notes per chart review to establish home regimen. Home regimen recommends water flushes q4 hours, but will hold off for now due to low sodium.   Nutrition focused physical exam shows severe depletions of muscle and fat.   Medications reviewed and include:  Thiamine  100mg  daily Magnesium sulfate Sodium chloride    Labs reviewed:  Sodium 126/ Chloride 94  NUTRITION - FOCUSED PHYSICAL EXAM:  Flowsheet Row Most Recent Value  Orbital Region Severe depletion  Upper Arm Region Severe depletion  Thoracic and Lumbar Region Severe depletion  Buccal Region Severe depletion  Temple Region Severe depletion  Clavicle Bone Region Severe depletion  Clavicle and Acromion Bone Region Severe depletion  Scapular Bone Region Severe depletion  Dorsal Hand Severe depletion  Patellar  Region Severe depletion  Anterior Thigh Region Severe depletion  Posterior Calf Region Severe depletion  Edema (RD Assessment) None  Hair Reviewed  Eyes Reviewed  Mouth Reviewed  Skin Reviewed  Nails Reviewed    Diet Order:    Diet Order             Diet full liquid Room service appropriate? Yes; Fluid consistency: Thin  Diet effective now                   EDUCATION NEEDS:   Education needs have been addressed  Skin:  Skin Assessment: Reviewed RN Assessment  Last BM:  unknown  Height:   Ht Readings from Last 1 Encounters:  09/13/23 5' 9 (1.753 m)   Weight:   Wt Readings from Last 1 Encounters:  09/13/23 47.6 kg   Ideal Body Weight:  72.7 kg  BMI:  Body mass index is 15.51 kg/m.  Estimated Nutritional Needs:   Kcal:  2100-2300  Protein:  90-105g  Fluid:  >/=2L  Josette Glance, MS, RDN, LDN Clinical Dietitian I Please reach out via secure chat

## 2023-09-14 NOTE — ED Notes (Signed)
 Patient is resting comfortably.

## 2023-09-14 NOTE — H&P (Signed)
 History and Physical    Jeff [name] FMW:[id number] DOB: [birth year] DOA: 09/13/2023  Patient coming from: Home.  Chief Complaint: Low sodium.  HPI: Jeff [name] is a 63 y.o. 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  Patient states recently over the last 2 weeks he started eating food orally and has been using Ensure.  Usually he gets most of his nutrition to PEG tube.  About 2 weeks ago he was treated for pneumonia with antibiotics.  Patient also has been drinking Gatorade every day usually about 20 ounces.  Denies any nausea vomiting or diarrhea.  Takes gabapentin for pain.  No other medications.  ED Course: In the ER patient's sodium was 124 in the recent past on December 2024 it was 135.  In the ER patient was given 1 L fluid bolus of normal saline.  Admitted for further observation.  Review of Systems: As per HPI, rest all negative.   Past Medical History:  Diagnosis Date   Cancer [facility])     Past Surgical History:  Procedure Laterality Date   NO PAST SURGERIES     ORIF ANKLE FRACTURE Left 01/24/2015   Procedure: OPEN REDUCTION INTERNAL FIXATION (ORIF) LEFT ANKLE FRACTURE;  Surgeon: Jeff Harden GAILS, MD;  Location: [facility] OR;  Service: Orthopedics;  Laterality: Left;     reports that he has been smoking cigarettes. He has a 30 pack-year smoking history. He has never used smokeless tobacco. He reports current alcohol use of about 2.0 standard drinks of alcohol per week. He reports current drug use. Drug: Marijuana.  No Known Allergies  History reviewed. No pertinent family history.  Prior to Admission medications   Medication Sig Start Date End Date Taking? Authorizing Provider  gabapentin (NEURONTIN) 300 MG capsule Take 300 mg by mouth 3 (three) times daily.    [provider]  loratadine  (CLARITIN) 5 MG/5ML syrup Place 10 mg into feeding tube daily.    [provider]  naloxone [facility]) nasal spray 4 mg/0.1 mL Place 1 spray into the nose as needed (respiratory depression after oxycodone  use). 12/19/22   [provider]  oxyCODONE -acetaminophen  (PERCOCET) 10-325 MG tablet Place 1 tablet into feeding tube every 6 (six) hours as needed for pain. Patient not taking: Reported on 06/22/2023 12/19/22   [provider]    Physical Exam: Constitutional: Poorly built and nourished. Vitals:   09/13/23 1753 09/13/23 2015 09/13/23 2224 09/14/23 0505  BP: 120/89  115/80 (!) 121/98  Pulse: 92  76 80  Resp: 14  17 18   Temp: 98.9 F (37.2 C)  97.7 F (36.5 C) 97.8 F (36.6 C)  TempSrc:    Oral  SpO2: 98%  100% 98%  Weight:  47.6 kg    Height:  5' 9 (1.753 m)     Eyes: Anicteric no pallor. ENMT: No discharge from the ears eyes nose and mouth. Neck: No mass felt.  No neck rigidity. Respiratory: No rhonchi or crepitations. Cardiovascular: S1-S2 heard. Abdomen: PEG tube in place.  No guarding or rigidity. Musculoskeletal: No edema. Skin: No rash. Neurologic: Alert awake oriented time place and person.  Moves all extremities. Psychiatric: Appears normal.  Normal affect.   Labs on Admission: I have personally reviewed following labs and imaging studies  CBC: Recent Labs  Lab 09/13/23  2027  WBC 3.1*  NEUTROABS 1.9  HGB 10.4*  HCT 30.4*  MCV 83.5  PLT 270   Basic Metabolic Panel: Recent Labs  Lab 09/13/23 2027  NA 124*  K 4.3  CL 91*  CO2 24  GLUCOSE 90  BUN 9  CREATININE 0.72  CALCIUM  9.7  MG 1.8   GFR: Estimated Creatinine Clearance: 64.5 mL/min (by C-G formula based on SCr of 0.72 mg/dL). Liver Function Tests: Recent Labs  Lab 09/13/23 2027  AST 20  ALT 8  ALKPHOS 71  BILITOT 0.5  PROT 7.7  ALBUMIN 3.7   No results for input(s): LIPASE, AMYLASE in the last 168 hours. No results for input(s): AMMONIA in the last 168  hours. Coagulation Profile: No results for input(s): INR, PROTIME in the last 168 hours. Cardiac Enzymes: No results for input(s): CKTOTAL, CKMB, CKMBINDEX, TROPONINI in the last 168 hours. BNP (last 3 results) No results for input(s): PROBNP in the last 8760 hours. HbA1C: No results for input(s): HGBA1C in the last 72 hours. CBG: No results for input(s): GLUCAP in the last 168 hours. Lipid Profile: No results for input(s): CHOL, HDL, LDLCALC, TRIG, CHOLHDL, LDLDIRECT in the last 72 hours. Thyroid  Function Tests: No results for input(s): TSH, T4TOTAL, FREET4, T3FREE, THYROIDAB in the last 72 hours. Anemia Panel: No results for input(s): VITAMINB12, FOLATE, FERRITIN, TIBC, IRON, RETICCTPCT in the last 72 hours. Urine analysis:    Component Value Date/Time   COLORURINE YELLOW 09/14/2023 0049   APPEARANCEUR CLOUDY (A) 09/14/2023 0049   LABSPEC 1.014 09/14/2023 0049   PHURINE 6.0 09/14/2023 0049   GLUCOSEU NEGATIVE 09/14/2023 0049   HGBUR NEGATIVE 09/14/2023 0049   BILIRUBINUR NEGATIVE 09/14/2023 0049   KETONESUR NEGATIVE 09/14/2023 0049   PROTEINUR NEGATIVE 09/14/2023 0049   UROBILINOGEN 0.2 04/23/2009 0826   NITRITE NEGATIVE 09/14/2023 0049   LEUKOCYTESUR NEGATIVE 09/14/2023 0049   Sepsis Labs: @LABRCNTIP (procalcitonin:4,lacticidven:4) )No results found for this or any previous visit (from the past 240 hours).   Radiological Exams on Admission: DG Chest 2 View Result Date: 09/14/2023 CLINICAL DATA:  Hyponatremia EXAM: CHEST - 2 VIEW COMPARISON:  Radiograph 12/23/2022 FINDINGS: Hyperinflation and chronic bronchitic change. No focal consolidation, pleural effusion, or pneumothorax. Suspicious nodule in the lateral right upper lung grossly similar to the nodule seen on CT 08/05/2023. Stable cardiomediastinal silhouette. Right chest wall Port-A-Cath tip in the mid SVC. No displaced rib fractures. IMPRESSION: 1. No acute abnormality.   Emphysema. 2. Suspicious Right upper lobe nodule similar to CT 08/05/2023. Electronically Signed   By: Jeff [name] M.D.   On: 09/14/2023 01:43      Assessment/Plan Principal Problem:   Hyponatremia Active Problems:   Squamous cell carcinoma of tongue (HCC)    Hyponatremia -     cause not clear.  Urine studies TSH and cortisol levels are pending.  Patient received 1 L fluid bolus in ER.  Will hold off further fluids until he gets repeat metabolic panel and test ordered. Squamous cell carcinoma of the base of the tongue status post chemoradiation being followed by oncologist in [facility].  Patient gets his nutrition mostly through the PEG tube and has recently started eating orally.  Will get nutrition consult.  Since patient has hyponatremia will need monitoring and further workup and more than 2 midnight stay.   DVT prophylaxis: Heparin. Code Status: Full code. Family Communication: Discussed with patient. Disposition Plan: Medical floor. Consults called: Nutrition consult. Admission status: Observation.

## 2023-09-15 DIAGNOSIS — E871 Hypo-osmolality and hyponatremia: Secondary | ICD-10-CM | POA: Diagnosis not present

## 2023-09-15 LAB — COMPREHENSIVE METABOLIC PANEL WITH GFR
ALT: 7 U/L (ref 0–44)
AST: 20 U/L (ref 15–41)
Albumin: 3.2 g/dL — ABNORMAL LOW (ref 3.5–5.0)
Alkaline Phosphatase: 62 U/L (ref 38–126)
Anion gap: 8 (ref 5–15)
BUN: 7 mg/dL — ABNORMAL LOW (ref 8–23)
CO2: 23 mmol/L (ref 22–32)
Calcium: 9.2 mg/dL (ref 8.9–10.3)
Chloride: 92 mmol/L — ABNORMAL LOW (ref 98–111)
Creatinine, Ser: 0.79 mg/dL (ref 0.61–1.24)
GFR, Estimated: >60 mL/min (ref >60)
Glucose, Bld: 79 mg/dL (ref 70–99)
Potassium: 4.6 mmol/L (ref 3.5–5.1)
Sodium: 123 mmol/L — ABNORMAL LOW (ref 135–145)
Total Bilirubin: 0.6 mg/dL (ref 0.0–1.2)
Total Protein: 6.6 g/dL (ref 6.5–8.1)

## 2023-09-15 LAB — PHOSPHORUS: Phosphorus: 3.3 mg/dL (ref 2.5–4.6)

## 2023-09-15 LAB — OSMOLALITY: Osmolality: 259 mosm/kg — ABNORMAL LOW (ref 275–295)

## 2023-09-15 LAB — URIC ACID: Uric Acid, Serum: 2.9 mg/dL — ABNORMAL LOW (ref 3.7–8.6)

## 2023-09-15 LAB — MAGNESIUM: Magnesium: 1.7 mg/dL (ref 1.7–2.4)

## 2023-09-15 LAB — GLUCOSE, CAPILLARY
Glucose-Capillary: 81 mg/dL (ref 70–99)
Glucose-Capillary: 107 mg/dL — ABNORMAL HIGH (ref 70–99)
Glucose-Capillary: 75 mg/dL (ref 70–99)
Glucose-Capillary: 70 mg/dL (ref 70–99)

## 2023-09-15 MED ORDER — JEVITY 1.5 CAL/FIBER PO LIQD
1000.0000 mL | ORAL | Status: DC
  Administered 2023-09-16 – 2023-09-17 (×2): 1000 mL
  Filled 2023-09-15 (×5): qty 1000

## 2023-09-15 NOTE — Plan of Care (Signed)

## 2023-09-15 NOTE — Plan of Care (Signed)
  Problem: Education: Goal: Knowledge of General Education information will improve Description: Including pain rating scale, medication(s)/side effects and non-pharmacologic comfort measures Outcome: Progressing   Problem: Clinical Measurements: Goal: Ability to maintain clinical measurements within normal limits will improve Outcome: Progressing Goal: Will remain free from infection Outcome: Progressing Goal: Diagnostic test results will improve Outcome: Progressing Goal: Respiratory complications will improve Outcome: Progressing Goal: Cardiovascular complication will be avoided Outcome: Progressing   Problem: Activity: Goal: Risk for activity intolerance will decrease Outcome: Progressing   Problem: Nutrition: Goal: Adequate nutrition will be maintained Outcome: Progressing   Problem: Safety: Goal: Ability to remain free from injury will improve Outcome: Progressing

## 2023-09-15 NOTE — Progress Notes (Signed)
 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  The patient has been PEG tube dependent but over the last 2 weeks has begun oral intake supplemented with Ensure.  He denied nausea vomiting or diarrhea.  In the ER his sodium was found to be 124.  He appeared hypovolemic at presentation.  Goals of Care:   Code Status: Full Code   DVT prophylaxis: enoxaparin (LOVENOX) injection 40 mg Start: 09/14/23 1000   Interim Hx: No acute events recorded overnight.  He is in good spirits.  Alert and conversant.  Denies any complaints whatsoever.  States he feels strong.  Is alert and oriented.  Denies tremulousness numbness or tingling.  No lethargy.  Tolerating oral intake without difficulty as per his usual home regimen.  Assessment & Plan:  Hypovolemic hyponatremia Sodium has proven stable over time, but has not improved with simple volume resuscitation -the patient appears to be asymptomatic -review of old records dating back 8 months to 2 years ago suggest a baseline sodium of 132-139 -cortisol is normal -TSH very mildly elevated at 5 - labs pending to eval for possible SIADH - encouraged ongoing consistent oral intake   Squamous cell carcinoma base of the tongue Status post chemotherapy and radiation which ended in December 2024 - followed by Oncologist at [facility]  PEG tube dependent Due to above - recently began to advance oral intake -continue usual home regimen  Lateral right upper lobe lung nodule Noted on CT May 2025 - appears stable on CXR this admission  Nutrition Problem: Severe Malnutrition Etiology: chronic illness (cancer and cancer related treatments) Signs/Symptoms: severe fat depletion, severe muscle  depletion, energy intake < or equal to 75% for > or equal to 1 month Interventions: Tube feeding, Ensure Enlive (each supplement provides 350kcal and 20 grams of protein)    Family Communication: No family present at time of exam Disposition: Possible discharge home tomorrow if sodium remains stable/improves   Objective: Blood pressure 103/81, pulse 66, temperature 97.7 F (36.5 C), temperature source Oral, resp. rate 19, height 5' 9 (1.753 m), weight 43.7 kg, SpO2 100%.  Intake/Output Summary (Last 24 hours) at 09/15/2023 1151 Last data filed at 09/15/2023 0300 Gross per 24 hour  Intake 363 ml  Output 350 ml  Net 13 ml   Filed Weights   09/13/23 2015 09/15/23 0500  Weight: 47.6 kg 43.7 kg    Examination: General: No acute respiratory distress Lungs: Clear to auscultation bilaterally without wheezes or crackles Cardiovascular: Regular rate and rhythm without murmur gallop or rub normal S1 and S2 Abdomen: Nontender, nondistended, soft, bowel sounds positive, no rebound, no ascites, no appreciable mass Extremities: No significant cyanosis, clubbing, or edema bilateral lower extremities   CBC: Recent Labs  Lab 09/13/23 2027 09/14/23 0614  WBC 3.1* 3.7*  NEUTROABS 1.9  --   HGB 10.4* 11.1*  HCT 30.4* 32.9*  MCV 83.5 85.5  PLT 270 299   Basic Metabolic Panel: Recent Labs  Lab 09/13/23 2027 09/14/23 0614 09/14/23 1414 09/15/23 0639  NA 124* 126* 124* 123*  K 4.3 4.6 4.7 4.6  CL 91* 94* 92* 92*  CO2 24 23 23 23   GLUCOSE 90 72 77 79  BUN 9 10 9  7*  CREATININE 0.72 0.69 0.73 0.79  CALCIUM  9.7 9.1 9.2 9.2  MG 1.8  --  2.0 1.7  PHOS  --   --  3.0 3.3   GFR: Estimated Creatinine Clearance: 59.2 mL/min (by C-G formula based on SCr of 0.79 mg/dL).   Scheduled Meds:  enoxaparin (LOVENOX) injection  40 mg Subcutaneous Q24H   feeding supplement  237 mL Oral BID BM   gabapentin  300 mg Per Tube TID   thiamine   100 mg Per Tube Daily      LOS: 1 day   [name], MD [facility] Office  [phone] Pager - Text Page per Tracey  If 7PM-7AM, please contact night-coverage per Amion 09/15/2023, 11:51 AM

## 2023-09-16 DIAGNOSIS — E871 Hypo-osmolality and hyponatremia: Secondary | ICD-10-CM | POA: Diagnosis not present

## 2023-09-16 LAB — BASIC METABOLIC PANEL WITH GFR
Anion gap: 7 (ref 5–15)
BUN: 8 mg/dL (ref 8–23)
CO2: 25 mmol/L (ref 22–32)
Calcium: 9.4 mg/dL (ref 8.9–10.3)
Chloride: 90 mmol/L — ABNORMAL LOW (ref 98–111)
Creatinine, Ser: 0.69 mg/dL (ref 0.61–1.24)
GFR, Estimated: >60 mL/min (ref >60)
Glucose, Bld: 81 mg/dL (ref 70–99)
Potassium: 4.8 mmol/L (ref 3.5–5.1)
Sodium: 122 mmol/L — ABNORMAL LOW (ref 135–145)

## 2023-09-16 LAB — GLUCOSE, CAPILLARY
Glucose-Capillary: 110 mg/dL — ABNORMAL HIGH (ref 70–99)
Glucose-Capillary: 79 mg/dL (ref 70–99)
Glucose-Capillary: 79 mg/dL (ref 70–99)
Glucose-Capillary: 80 mg/dL (ref 70–99)
Glucose-Capillary: 93 mg/dL (ref 70–99)

## 2023-09-16 LAB — PHOSPHORUS: Phosphorus: 3.1 mg/dL (ref 2.5–4.6)

## 2023-09-16 LAB — SODIUM, URINE, RANDOM: Sodium, Ur: 70 mmol/L

## 2023-09-16 LAB — MAGNESIUM: Magnesium: 1.7 mg/dL (ref 1.7–2.4)

## 2023-09-16 LAB — OSMOLALITY, URINE: Osmolality, Ur: 420 mosm/kg (ref 300–900)

## 2023-09-16 MED ORDER — SODIUM CHLORIDE 1 G PO TABS
1.0000 g | ORAL_TABLET | Freq: Two times a day (BID) | ORAL | Status: DC
  Administered 2023-09-16 – 2023-09-18 (×4): 1 g via ORAL
  Filled 2023-09-16 (×5): qty 1

## 2023-09-16 MED ORDER — FUROSEMIDE 20 MG PO TABS
20.0000 mg | ORAL_TABLET | Freq: Two times a day (BID) | ORAL | Status: DC
  Administered 2023-09-16 – 2023-09-18 (×4): 20 mg via ORAL
  Filled 2023-09-16 (×4): qty 1

## 2023-09-16 NOTE — Plan of Care (Signed)

## 2023-09-16 NOTE — Plan of Care (Signed)

## 2023-09-16 NOTE — Progress Notes (Signed)
 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  The patient has been PEG tube dependent but over the last 2 weeks has begun oral intake supplemented with Ensure.  He denied nausea vomiting or diarrhea.  In the ER his sodium was found to be 124.  He appeared hypovolemic at presentation.  Goals of Care:   Code Status: Full Code   DVT prophylaxis: enoxaparin  (LOVENOX ) injection 40 mg Start: 09/14/23 1000   Interim Hx: Afebrile.  Vital signs stable.  Alert oriented and conversant.  Ambulating freely with no weakness.  Has no complaints today.  Denies shortness of breath or chest pain.  Intake remains stable.  Is worried about his persistently low sodium level.  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 the patient appears to be asymptomatic - review of old records dating back 8 months to 2 years ago suggest a baseline sodium of 132-139 -cortisol is normal -TSH very mildly elevated at 5 -has not responded to volume expansion -sodium continues to very slowly decline -low serum osmolality with urine sodium of 70 and urine osmolality of 420 are suggestive of SIADH - pt at risk given hx of RUL lung nodule and tx for CA - fluid restriction not a valid approach as patient has limited intake at baseline based on his CA of the tongue - give trial of Na tablets and lasix  - if this fails, will consider trial of tolvaptan    Squamous cell carcinoma base of the tongue Status post chemotherapy and radiation which ended in December 2024 - followed by Oncologist at [facility]  PEG tube dependent Due to above - recently began to advance oral intake - continue usual home regimen  Lateral right upper lobe lung  nodule Noted on CT May 2025 - appears stable on CXR this admission -outpatient workup being coordinated through his oncologist  Nutrition Problem: Severe Malnutrition Etiology: chronic illness (cancer and cancer related treatments) Signs/Symptoms: severe fat depletion, severe muscle depletion, energy intake < or equal to 75% for > or equal to 1 month Interventions: Tube feeding, Ensure Enlive (each supplement provides 350kcal and 20 grams of protein)    Family Communication: No family present at time of exam Disposition: Possible discharge home tomorrow if sodium improves   Objective: Blood pressure 117/82, pulse 73, temperature 97.6 F (36.4 C), temperature source Oral, resp. rate 16, height 5' 9 (1.753 m), weight 43.7 kg, SpO2 99%. No intake or output data in the 24 hours ending 09/16/23 1027  Filed Weights   09/13/23 2015 09/15/23 0500 09/16/23 0523  Weight: 47.6 kg 43.7 kg 43.7 kg    Examination: General: No acute respiratory distress Lungs: Clear to auscultation bilaterally  Cardiovascular: Regular rate and rhythm without murmur gallop or rub normal S1 and S2 Abdomen: Nontender, nondistended, soft, bowel sounds positive, no rebound, no ascites Extremities: No significant edema bilateral lower extremities   CBC: Recent Labs  Lab 09/13/23 2027 09/14/23 0614  WBC 3.1* 3.7*  NEUTROABS 1.9  --   HGB 10.4* 11.1*  HCT 30.4* 32.9*  MCV 83.5 85.5  PLT 270 299   Basic Metabolic Panel: Recent Labs  Lab 09/14/23 1414 09/15/23 0639 09/16/23 0621  NA 124* 123* 122*  K 4.7 4.6 4.8  CL 92* 92* 90*  CO2 23 23 25   GLUCOSE 77 79 81  BUN 9 7* 8  CREATININE 0.73 0.79 0.69  CALCIUM  9.2 9.2 9.4  MG 2.0 1.7 1.7  PHOS 3.0 3.3 3.1   GFR: Estimated Creatinine Clearance: 59.2 mL/min (by C-G formula based on SCr of 0.69 mg/dL).   Scheduled Meds:  enoxaparin  (LOVENOX ) injection  40 mg Subcutaneous Q24H   feeding supplement  237 mL Oral BID BM   feeding supplement (JEVITY  1.5 CAL/FIBER)  1,000 mL Per Tube Q24H   gabapentin   300 mg Per Tube TID   thiamine   100 mg Per Tube Daily      LOS: 2 days   [name], MD [facility] Office  [phone] Pager - Text Page per Tracey  If 7PM-7AM, please contact night-coverage per Amion 09/16/2023, 10:27 AM

## 2023-09-17 DIAGNOSIS — E871 Hypo-osmolality and hyponatremia: Secondary | ICD-10-CM | POA: Diagnosis not present

## 2023-09-17 LAB — BASIC METABOLIC PANEL WITH GFR
Anion gap: 10 (ref 5–15)
Anion gap: 10 (ref 5–15)
BUN: 11 mg/dL (ref 8–23)
BUN: 10 mg/dL (ref 8–23)
CO2: 26 mmol/L (ref 22–32)
CO2: 26 mmol/L (ref 22–32)
Calcium: 9.3 mg/dL (ref 8.9–10.3)
Calcium: 9.3 mg/dL (ref 8.9–10.3)
Chloride: 85 mmol/L — ABNORMAL LOW (ref 98–111)
Chloride: 86 mmol/L — ABNORMAL LOW (ref 98–111)
Creatinine, Ser: 0.65 mg/dL (ref 0.61–1.24)
Creatinine, Ser: 0.71 mg/dL (ref 0.61–1.24)
GFR, Estimated: >60 mL/min
GFR, Estimated: >60 mL/min (ref >60)
Glucose, Bld: 73 mg/dL (ref 70–99)
Glucose, Bld: 75 mg/dL (ref 70–99)
Potassium: 5 mmol/L (ref 3.5–5.1)
Potassium: 5 mmol/L (ref 3.5–5.1)
Sodium: 121 mmol/L — ABNORMAL LOW (ref 135–145)
Sodium: 122 mmol/L — ABNORMAL LOW (ref 135–145)

## 2023-09-17 LAB — GLUCOSE, CAPILLARY
Glucose-Capillary: 89 mg/dL (ref 70–99)
Glucose-Capillary: 63 mg/dL — ABNORMAL LOW (ref 70–99)
Glucose-Capillary: 76 mg/dL (ref 70–99)
Glucose-Capillary: 52 mg/dL — ABNORMAL LOW (ref 70–99)
Glucose-Capillary: 76 mg/dL (ref 70–99)
Glucose-Capillary: 95 mg/dL (ref 70–99)
Glucose-Capillary: 102 mg/dL — ABNORMAL HIGH (ref 70–99)

## 2023-09-17 NOTE — Plan of Care (Signed)
  Problem: Pain Managment: Goal: General experience of comfort will improve and/or be controlled Outcome: Progressing   Problem: Safety: Goal: Ability to remain free from injury will improve Outcome: Progressing

## 2023-09-17 NOTE — Progress Notes (Addendum)
 Patient with hypoglycemic episode this morning- BG 63, orange juice given, BG improved to 71 BG at 12:00 decreased to 52. Orange juice given. Rechecked 15 min later, improved to 76 Patient asymptomatic  McClung MD notified via epic chat.

## 2023-09-17 NOTE — Progress Notes (Signed)
 Patient requested to stop his peg tube before he goes to sleep because he don,t want to be choke.MD made aware.

## 2023-09-17 NOTE — Progress Notes (Signed)
 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  The patient has been PEG tube dependent but over the last 2 weeks has begun oral intake supplemented with Ensure.  He denied nausea vomiting or diarrhea.  In the ER his sodium was found to be 124.  He appeared hypovolemic at presentation.  Goals of Care:   Code Status: Full Code   DVT prophylaxis: enoxaparin  (LOVENOX ) injection 40 mg Start: 09/14/23 1000   Interim Hx: No acute events reported overnight.  Afebrile.  Vital signs stable.  Sodium continues to slowly decline.  No new complaints today.  In good spirits.  Ambulating without difficulty.  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 the patient appears to be asymptomatic - review of old records dating back 8 months to 2 years ago suggest a baseline sodium of 132-139 - cortisol is normal - TSH very mildly elevated at 5 - has not responded to volume expansion - sodium continues to very slowly decline - low serum osmolality with urine sodium of 70 and urine osmolality of 420 are suggestive of SIADH - pt at risk given hx of RUL lung nodule and tx for CA - fluid restriction not a valid approach as patient has limited intake at baseline based on his CA of the tongue - giving trial of Na tablets and lasix  - if this fails, will consider trial of tolvaptan    Squamous cell carcinoma base of the tongue Status post chemotherapy and radiation which ended in December 2024 - followed by Oncologist at [facility]  PEG tube dependent Due to above - recently began to advance oral intake - continue usual home regimen  Lateral right upper lobe lung nodule Noted on CT May 2025 - appears stable on CXR this admission  -outpatient workup being coordinated through his oncologist, with patient scheduled for IR biopsy 7/8  Nutrition Problem: Severe Malnutrition Etiology: chronic illness (cancer and cancer related treatments) Signs/Symptoms: severe fat depletion, severe muscle depletion, energy intake < or equal to 75% for > or equal to 1 month Interventions: Tube feeding, Ensure Enlive (each supplement provides 350kcal and 20 grams of protein)    Family Communication: No family present at time of exam Disposition: Possible discharge home tomorrow if sodium improves   Objective: Blood pressure 109/75, pulse 83, temperature 97.9 F (36.6 C), temperature source Oral, resp. rate 16, height 5' 9 (1.753 m), weight 43.7 kg, SpO2 99%. No intake or output data in the 24 hours ending 09/17/23 1041  Filed Weights   09/15/23 0500 09/16/23 0523 09/17/23 0500  Weight: 43.7 kg 43.7 kg 43.7 kg    Examination: General: No acute respiratory distress Lungs: Clear to auscultation bilaterally  Cardiovascular: Regular rate and rhythm without murmur  Abdomen: Nontender, nondistended, soft, bowel sounds positive, no rebound, no ascites Extremities: No significant edema bilateral lower extremities   CBC: Recent Labs  Lab 09/13/23 2027 09/14/23 0614  WBC 3.1* 3.7*  NEUTROABS 1.9  --   HGB 10.4* 11.1*  HCT 30.4* 32.9*  MCV 83.5 85.5  PLT 270 299   Basic Metabolic Panel: Recent Labs  Lab 09/14/23 1414 09/15/23 0639 09/16/23 0621 09/17/23 0425  NA 124* 123* 122* 121*  K 4.7 4.6  4.8 5.0  CL 92* 92* 90* 85*  CO2 23 23 25 26   GLUCOSE 77 79 81 73  BUN 9 7* 8 11  CREATININE 0.73 0.79 0.69 0.65  CALCIUM  9.2 9.2 9.4 9.3  MG 2.0 1.7 1.7  --   PHOS 3.0 3.3 3.1  --    GFR: Estimated Creatinine Clearance: 59.2 mL/min (by C-G formula based on SCr of 0.65 mg/dL).   Scheduled Meds:  enoxaparin  (LOVENOX ) injection  40 mg Subcutaneous Q24H   feeding supplement  237 mL Oral BID BM   feeding supplement (JEVITY  1.5 CAL/FIBER)  1,000 mL Per Tube Q24H   furosemide   20 mg Oral BID   gabapentin   300 mg Per Tube TID   sodium chloride   1 g Oral BID WC   thiamine   100 mg Per Tube Daily      LOS: 3 days   [name], MD [facility] Office  [phone] Pager - Text Page per Tracey  If 7PM-7AM, please contact night-coverage per Amion 09/17/2023, 10:41 AM

## 2023-09-18 DIAGNOSIS — E871 Hypo-osmolality and hyponatremia: Secondary | ICD-10-CM | POA: Diagnosis not present

## 2023-09-18 LAB — GLUCOSE, CAPILLARY
Glucose-Capillary: 80 mg/dL (ref 70–99)
Glucose-Capillary: 98 mg/dL (ref 70–99)
Glucose-Capillary: 103 mg/dL — ABNORMAL HIGH (ref 70–99)
Glucose-Capillary: 94 mg/dL (ref 70–99)

## 2023-09-18 LAB — BASIC METABOLIC PANEL WITH GFR
Anion gap: 9 (ref 5–15)
BUN: 15 mg/dL (ref 8–23)
CO2: 27 mmol/L (ref 22–32)
Calcium: 9.3 mg/dL (ref 8.9–10.3)
Chloride: 88 mmol/L — ABNORMAL LOW (ref 98–111)
Creatinine, Ser: 0.63 mg/dL (ref 0.61–1.24)
GFR, Estimated: >60 mL/min (ref >60)
Glucose, Bld: 94 mg/dL (ref 70–99)
Potassium: 4.8 mmol/L (ref 3.5–5.1)
Sodium: 124 mmol/L — ABNORMAL LOW (ref 135–145)

## 2023-09-18 MED ORDER — SODIUM CHLORIDE 1 G PO TABS: 1.0000 g | ORAL_TABLET | Freq: Two times a day (BID) | ORAL | 2 refills | Status: AC

## 2023-09-18 MED ORDER — FUROSEMIDE 20 MG PO TABS: 20.0000 mg | ORAL_TABLET | Freq: Two times a day (BID) | ORAL | 2 refills | Status: AC

## 2023-09-18 NOTE — Plan of Care (Signed)
  Problem: Nutrition: Goal: Adequate nutrition will be maintained Outcome: Progressing   Problem: Elimination: Goal: Will not experience complications related to bowel motility Outcome: Progressing   Problem: Safety: Goal: Ability to remain free from injury will improve Outcome: Progressing   Problem: Coping: Goal: Level of anxiety will decrease Outcome: Progressing

## 2023-09-18 NOTE — Discharge Instructions (Signed)
 RESUME YOUR USUAL FEEDING TUBE LIQUID AT HOME AS YOU WERE BEFORE YOU WERE ADMITTED.   IT IS VERY IMPORTANT THAT YOU SEE YOU DOCTOR IN 3 DAYS TO HAVE YOUR SODIUM LEVEL CHECKED AGAIN.

## 2023-09-18 NOTE — Discharge Summary (Signed)
 DISCHARGE SUMMARY  Jeff [name]  MR#: [id number]  DOB:August 22, 1960  Date of Admission: 09/13/2023 Date of Discharge: 09/18/2023  Attending Physician:Jeff [name] Jeff [name]  Patient's ERE:[name], Jeff [name]  Disposition: D/C home   Follow-up Appts:  Follow-up Information     Fernand Jeff [name] Follow up in 3 day(s).   Specialty: Oncology Contact information: [address] 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 The patient has been PEG tube dependent but over the last 2 weeks has begun oral intake supplemented with Ensure. He denied nausea vomiting or diarrhea. In the ER his sodium was found to be 124. He appeared hypovolemic at presentation.   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 the patient has remained asymptomatic - review of old records dating back 8 months to 2 years ago suggest a baseline sodium of 132-139 - cortisol is normal - TSH very mildly elevated at 5 - did not respond to volume expansion - sodium continued to very slowly decline during initial portion of hospital stay -further workup revealed low serum osmolality with urine sodium of 70 and urine osmolality of 420 suggestive of SIADH - pt at risk given hx of RUL lung nodule and tx for CA - fluid restriction not a valid approach as patient has limited intake at baseline based on his CA of the  tongue - trial of Na tablets and low-dose lasix  resulted in improved sodium - pt advised of need to remain fully compliant with meds prescribed, as well as need for f/u labwork - he reports that he has multiple providers and will have not difficulty having blood work done as an outpatient    Squamous cell carcinoma base of the tongue Status post chemotherapy and radiation which ended in December 2024 - followed by Oncologist at [facility]   PEG tube dependent Due to above - recently began to advance oral intake - continue usual home regimen after discharge   Lateral right upper lobe lung nodule Noted on CT May 2025 - appears stable on CXR this admission -outpatient workup being coordinated through his [facility]Oncologist team, with patient scheduled for IR biopsy 7/8   Nutrition Problem: Severe Malnutrition Etiology: chronic illness (cancer and cancer related treatments) Signs/Symptoms: severe fat depletion, severe muscle depletion, energy intake < or equal to 75% for > or equal to 1 month Interventions: Tube feeding, Ensure Enlive (each supplement provides 350kcal and 20 grams of protein)  Allergies as of 09/18/2023   No Known Allergies      Medication List     TAKE these medications    acetaminophen  500 MG tablet Commonly known as: TYLENOL  Take 500-1,000 mg by mouth every 6 (six) hours as needed for moderate pain (pain score 4-6).   Dextromethorphan-guaiFENesin  20-200 MG/10ML Syrp Take 20 mLs by mouth every 4 (four) hours as needed (Cough).   furosemide  20 MG tablet Commonly  known as: LASIX  Take 1 tablet (20 mg total) by mouth 2 (two) times daily.   gabapentin  300 MG capsule Commonly known as: NEURONTIN  Take 300 mg by mouth 3 (three) times daily.   naloxone 4 MG/0.1ML Liqd nasal spray kit Commonly known as: NARCAN Place 1 spray into the nose as needed (respiratory depression after oxycodone  use).   prochlorperazine 10 MG tablet Commonly known as: COMPAZINE Take 10 mg by  mouth every 6 (six) hours as needed for vomiting or nausea.   sodium chloride  1 g tablet Take 1 tablet (1 g total) by mouth 2 (two) times daily with a meal.   triamcinolone ointment 0.1 % Commonly known as: KENALOG Apply 1 Application topically 2 (two) times daily.        Day of Discharge BP 100/61 (BP Location: Left Arm)   Pulse 89   Temp 98.2 F (36.8 C) (Oral)   Resp 16   Ht 5' 9 (1.753 m)   Wt 44.2 kg   SpO2 100%   BMI 14.39 kg/m   Physical Exam: General: No acute respiratory distress Lungs: Clear to auscultation bilaterally without wheezes or crackles Cardiovascular: Regular rate and rhythm without murmur gallop or rub normal S1 and S2 Abdomen: Nontender, nondistended, soft, bowel sounds positive, no rebound, no ascites, no appreciable mass Extremities: No significant cyanosis, clubbing, or edema bilateral lower extremities  Basic Metabolic Panel: Recent Labs  Lab 09/13/23 2027 09/14/23 0614 09/14/23 1414 09/15/23 0639 09/16/23 0621 09/17/23 0425 09/17/23 1531 09/18/23 0520  NA 124*   < > 124* 123* 122* 121* 122* 124*  K 4.3   < > 4.7 4.6 4.8 5.0 5.0 4.8  CL 91*   < > 92* 92* 90* 85* 86* 88*  CO2 24   < > 23 23 25 26 26 27   GLUCOSE 90   < > 77 79 81 73 75 94  BUN 9   < > 9 7* 8 11 10 15   CREATININE 0.72   < > 0.73 0.79 0.69 0.65 0.71 0.63  CALCIUM  9.7   < > 9.2 9.2 9.4 9.3 9.3 9.3  MG 1.8  --  2.0 1.7 1.7  --   --   --   PHOS  --   --  3.0 3.3 3.1  --   --   --    < > = values in this interval not displayed.    CBC: Recent Labs  Lab 09/13/23 2027 09/14/23 0614  WBC 3.1* 3.7*  NEUTROABS 1.9  --   HGB 10.4* 11.1*  HCT 30.4* 32.9*  MCV 83.5 85.5  PLT 270 299    Time spent in discharge (includes decision making & examination of pt): 35 minutes  09/18/2023, 10:38 AM   [name], [name] [facility] Office  [phone]

## 2023-09-18 NOTE — Plan of Care (Signed)
 Peg site cleaned.  TF Jevity 1.5 infusing at 40ml.  Prn Tylenol  given for throat pain.  Problem: Education: Goal: Knowledge of General Education information will improve Description: Including pain rating scale, medication(s)/side effects and non-pharmacologic comfort measures Outcome: Progressing   Problem: Health Behavior/Discharge Planning: Goal: Ability to manage health-related needs will improve Outcome: Progressing   Problem: Clinical Measurements: Goal: Ability to maintain clinical measurements within normal limits will improve Outcome: Progressing Goal: Will remain free from infection Outcome: Progressing Goal: Diagnostic test results will improve Outcome: Progressing Goal: Respiratory complications will improve Outcome: Progressing Goal: Cardiovascular complication will be avoided Outcome: Progressing

## 2023-09-20 DIAGNOSIS — J439 Emphysema, unspecified: Secondary | ICD-10-CM | POA: Diagnosis not present

## 2023-09-20 DIAGNOSIS — C3411 Malignant neoplasm of upper lobe, right bronchus or lung: Secondary | ICD-10-CM | POA: Diagnosis not present

## 2023-09-20 DIAGNOSIS — R911 Solitary pulmonary nodule: Secondary | ICD-10-CM | POA: Diagnosis not present

## 2023-09-24 DIAGNOSIS — Z419 Encounter for procedure for purposes other than remedying health state, unspecified: Secondary | ICD-10-CM | POA: Diagnosis not present

## 2023-09-27 DIAGNOSIS — Z87891 Personal history of nicotine dependence: Secondary | ICD-10-CM | POA: Diagnosis not present

## 2023-09-27 DIAGNOSIS — C3411 Malignant neoplasm of upper lobe, right bronchus or lung: Secondary | ICD-10-CM | POA: Diagnosis not present

## 2023-09-27 DIAGNOSIS — Z7689 Persons encountering health services in other specified circumstances: Secondary | ICD-10-CM | POA: Diagnosis not present

## 2023-09-27 DIAGNOSIS — Z923 Personal history of irradiation: Secondary | ICD-10-CM | POA: Diagnosis not present

## 2023-09-27 DIAGNOSIS — C029 Malignant neoplasm of tongue, unspecified: Secondary | ICD-10-CM | POA: Diagnosis not present

## 2023-09-27 DIAGNOSIS — Z9221 Personal history of antineoplastic chemotherapy: Secondary | ICD-10-CM | POA: Diagnosis not present

## 2023-09-27 DIAGNOSIS — C01 Malignant neoplasm of base of tongue: Secondary | ICD-10-CM | POA: Diagnosis not present

## 2023-09-30 DIAGNOSIS — G894 Chronic pain syndrome: Secondary | ICD-10-CM | POA: Diagnosis not present

## 2023-09-30 DIAGNOSIS — Z7689 Persons encountering health services in other specified circumstances: Secondary | ICD-10-CM | POA: Diagnosis not present

## 2023-09-30 DIAGNOSIS — C140 Malignant neoplasm of pharynx, unspecified: Secondary | ICD-10-CM | POA: Diagnosis not present

## 2023-09-30 DIAGNOSIS — R519 Headache, unspecified: Secondary | ICD-10-CM | POA: Diagnosis not present

## 2023-10-14 DIAGNOSIS — C01 Malignant neoplasm of base of tongue: Secondary | ICD-10-CM | POA: Diagnosis not present

## 2023-10-17 NOTE — Progress Notes (Signed)
 The patient attended a screening event on 07/23/2023, where his blood pressure was measured at 93/75 mmHg, and his non-fasting blood glucose was 66 mg/dl. During the event, the patient did not report his smoking status, insurance, and PCP. The pt declined SDOH screener. Pt did not sign his screening form.   A chart review indicates that the patient has Dr. [name] Bathe - [facility] as his PCP. Chart review further indicates BCBS and Medicaid as his insurance.   The patient's most recent office visit was on 09/13/2023. He has an upcoming appt with this provider on 03/27/2024 at 2:30 PM. Pt has a history of seeing this provider within a 12 month period as well. Visit notes also indicates Dr. Donald Lai - [facility] as his PCP as of 08/05/23. CHW called [facility] to obtain PCP status. The office shared that the pt is not established with them. CHW will keep PCP status as indicated due to upcoming appt details.   At this time, no additional support from the Health Equity Team is indicated.

## 2023-10-21 DIAGNOSIS — C3411 Malignant neoplasm of upper lobe, right bronchus or lung: Secondary | ICD-10-CM | POA: Diagnosis not present

## 2023-10-21 DIAGNOSIS — Z7689 Persons encountering health services in other specified circumstances: Secondary | ICD-10-CM | POA: Diagnosis not present

## 2023-10-21 DIAGNOSIS — R918 Other nonspecific abnormal finding of lung field: Secondary | ICD-10-CM | POA: Diagnosis not present

## 2023-10-21 DIAGNOSIS — I700 Atherosclerosis of aorta: Secondary | ICD-10-CM | POA: Diagnosis not present

## 2023-10-21 DIAGNOSIS — R590 Localized enlarged lymph nodes: Secondary | ICD-10-CM | POA: Diagnosis not present

## 2023-10-25 DIAGNOSIS — Z419 Encounter for procedure for purposes other than remedying health state, unspecified: Secondary | ICD-10-CM | POA: Diagnosis not present

## 2023-10-25 DIAGNOSIS — C01 Malignant neoplasm of base of tongue: Secondary | ICD-10-CM | POA: Diagnosis not present

## 2023-10-25 DIAGNOSIS — Z510 Encounter for antineoplastic radiation therapy: Secondary | ICD-10-CM | POA: Diagnosis not present

## 2023-10-25 DIAGNOSIS — C3411 Malignant neoplasm of upper lobe, right bronchus or lung: Secondary | ICD-10-CM | POA: Diagnosis not present

## 2023-10-25 DIAGNOSIS — Z7689 Persons encountering health services in other specified circumstances: Secondary | ICD-10-CM | POA: Diagnosis not present

## 2023-10-25 DIAGNOSIS — C029 Malignant neoplasm of tongue, unspecified: Secondary | ICD-10-CM | POA: Diagnosis not present

## 2023-11-11 DIAGNOSIS — Z510 Encounter for antineoplastic radiation therapy: Secondary | ICD-10-CM | POA: Diagnosis not present

## 2023-11-11 DIAGNOSIS — C3411 Malignant neoplasm of upper lobe, right bronchus or lung: Secondary | ICD-10-CM | POA: Diagnosis not present

## 2023-11-11 DIAGNOSIS — C01 Malignant neoplasm of base of tongue: Secondary | ICD-10-CM | POA: Diagnosis not present

## 2023-11-14 DIAGNOSIS — C01 Malignant neoplasm of base of tongue: Secondary | ICD-10-CM | POA: Diagnosis not present

## 2023-11-17 DIAGNOSIS — Z931 Gastrostomy status: Secondary | ICD-10-CM | POA: Diagnosis not present

## 2023-11-17 DIAGNOSIS — C3411 Malignant neoplasm of upper lobe, right bronchus or lung: Secondary | ICD-10-CM | POA: Diagnosis not present

## 2023-11-17 DIAGNOSIS — Z510 Encounter for antineoplastic radiation therapy: Secondary | ICD-10-CM | POA: Diagnosis not present

## 2023-11-17 DIAGNOSIS — C01 Malignant neoplasm of base of tongue: Secondary | ICD-10-CM | POA: Diagnosis not present

## 2023-11-18 DIAGNOSIS — C3411 Malignant neoplasm of upper lobe, right bronchus or lung: Secondary | ICD-10-CM | POA: Diagnosis not present

## 2023-11-18 DIAGNOSIS — Z931 Gastrostomy status: Secondary | ICD-10-CM | POA: Diagnosis not present

## 2023-11-18 DIAGNOSIS — Z510 Encounter for antineoplastic radiation therapy: Secondary | ICD-10-CM | POA: Diagnosis not present

## 2023-11-18 DIAGNOSIS — C01 Malignant neoplasm of base of tongue: Secondary | ICD-10-CM | POA: Diagnosis not present

## 2023-11-18 DIAGNOSIS — Z7689 Persons encountering health services in other specified circumstances: Secondary | ICD-10-CM | POA: Diagnosis not present

## 2023-11-21 DIAGNOSIS — C01 Malignant neoplasm of base of tongue: Secondary | ICD-10-CM | POA: Diagnosis not present

## 2023-11-21 DIAGNOSIS — Z931 Gastrostomy status: Secondary | ICD-10-CM | POA: Diagnosis not present

## 2023-11-21 DIAGNOSIS — Z7689 Persons encountering health services in other specified circumstances: Secondary | ICD-10-CM | POA: Diagnosis not present

## 2023-11-21 DIAGNOSIS — C3411 Malignant neoplasm of upper lobe, right bronchus or lung: Secondary | ICD-10-CM | POA: Diagnosis not present

## 2023-11-21 DIAGNOSIS — Z510 Encounter for antineoplastic radiation therapy: Secondary | ICD-10-CM | POA: Diagnosis not present

## 2023-11-22 DIAGNOSIS — Z510 Encounter for antineoplastic radiation therapy: Secondary | ICD-10-CM | POA: Diagnosis not present

## 2023-11-22 DIAGNOSIS — C01 Malignant neoplasm of base of tongue: Secondary | ICD-10-CM | POA: Diagnosis not present

## 2023-11-22 DIAGNOSIS — Z7689 Persons encountering health services in other specified circumstances: Secondary | ICD-10-CM | POA: Diagnosis not present

## 2023-11-22 DIAGNOSIS — C3411 Malignant neoplasm of upper lobe, right bronchus or lung: Secondary | ICD-10-CM | POA: Diagnosis not present

## 2023-11-22 DIAGNOSIS — Z931 Gastrostomy status: Secondary | ICD-10-CM | POA: Diagnosis not present

## 2023-11-23 DIAGNOSIS — C01 Malignant neoplasm of base of tongue: Secondary | ICD-10-CM | POA: Diagnosis not present

## 2023-11-23 DIAGNOSIS — C3411 Malignant neoplasm of upper lobe, right bronchus or lung: Secondary | ICD-10-CM | POA: Diagnosis not present

## 2023-11-23 DIAGNOSIS — Z7689 Persons encountering health services in other specified circumstances: Secondary | ICD-10-CM | POA: Diagnosis not present

## 2023-11-23 DIAGNOSIS — Z931 Gastrostomy status: Secondary | ICD-10-CM | POA: Diagnosis not present

## 2023-11-23 DIAGNOSIS — Z510 Encounter for antineoplastic radiation therapy: Secondary | ICD-10-CM | POA: Diagnosis not present

## 2023-11-23 DIAGNOSIS — R1314 Dysphagia, pharyngoesophageal phase: Secondary | ICD-10-CM | POA: Diagnosis not present

## 2023-11-25 DIAGNOSIS — Z419 Encounter for procedure for purposes other than remedying health state, unspecified: Secondary | ICD-10-CM | POA: Diagnosis not present

## 2023-11-30 DIAGNOSIS — R6339 Other feeding difficulties: Secondary | ICD-10-CM | POA: Diagnosis not present

## 2023-11-30 DIAGNOSIS — R1314 Dysphagia, pharyngoesophageal phase: Secondary | ICD-10-CM | POA: Diagnosis not present

## 2023-11-30 DIAGNOSIS — C01 Malignant neoplasm of base of tongue: Secondary | ICD-10-CM | POA: Diagnosis not present

## 2023-11-30 DIAGNOSIS — Z85810 Personal history of malignant neoplasm of tongue: Secondary | ICD-10-CM | POA: Diagnosis not present

## 2023-12-06 DIAGNOSIS — Z7689 Persons encountering health services in other specified circumstances: Secondary | ICD-10-CM | POA: Diagnosis not present

## 2023-12-07 DIAGNOSIS — C01 Malignant neoplasm of base of tongue: Secondary | ICD-10-CM | POA: Diagnosis not present

## 2023-12-07 DIAGNOSIS — R1314 Dysphagia, pharyngoesophageal phase: Secondary | ICD-10-CM | POA: Diagnosis not present

## 2023-12-14 DIAGNOSIS — C01 Malignant neoplasm of base of tongue: Secondary | ICD-10-CM | POA: Diagnosis not present

## 2023-12-25 DIAGNOSIS — Z419 Encounter for procedure for purposes other than remedying health state, unspecified: Secondary | ICD-10-CM | POA: Diagnosis not present

## 2023-12-28 DIAGNOSIS — C01 Malignant neoplasm of base of tongue: Secondary | ICD-10-CM | POA: Diagnosis not present

## 2023-12-28 DIAGNOSIS — R1314 Dysphagia, pharyngoesophageal phase: Secondary | ICD-10-CM | POA: Diagnosis not present

## 2023-12-28 DIAGNOSIS — Z7689 Persons encountering health services in other specified circumstances: Secondary | ICD-10-CM | POA: Diagnosis not present

## 2024-01-05 DIAGNOSIS — R490 Dysphonia: Secondary | ICD-10-CM | POA: Diagnosis not present

## 2024-01-05 DIAGNOSIS — Z85810 Personal history of malignant neoplasm of tongue: Secondary | ICD-10-CM | POA: Diagnosis not present

## 2024-01-05 DIAGNOSIS — R1314 Dysphagia, pharyngoesophageal phase: Secondary | ICD-10-CM | POA: Diagnosis not present

## 2024-01-05 DIAGNOSIS — Z7689 Persons encountering health services in other specified circumstances: Secondary | ICD-10-CM | POA: Diagnosis not present

## 2024-01-12 DIAGNOSIS — Z7689 Persons encountering health services in other specified circumstances: Secondary | ICD-10-CM | POA: Diagnosis not present

## 2024-01-18 DIAGNOSIS — C01 Malignant neoplasm of base of tongue: Secondary | ICD-10-CM | POA: Diagnosis not present

## 2024-01-18 DIAGNOSIS — Z7689 Persons encountering health services in other specified circumstances: Secondary | ICD-10-CM | POA: Diagnosis not present

## 2024-01-18 DIAGNOSIS — R1314 Dysphagia, pharyngoesophageal phase: Secondary | ICD-10-CM | POA: Diagnosis not present

## 2024-01-18 DIAGNOSIS — R638 Other symptoms and signs concerning food and fluid intake: Secondary | ICD-10-CM | POA: Diagnosis not present

## 2024-01-18 DIAGNOSIS — R1310 Dysphagia, unspecified: Secondary | ICD-10-CM | POA: Diagnosis not present

## 2024-01-19 DIAGNOSIS — Z7689 Persons encountering health services in other specified circumstances: Secondary | ICD-10-CM | POA: Diagnosis not present

## 2024-01-21 DIAGNOSIS — C01 Malignant neoplasm of base of tongue: Secondary | ICD-10-CM | POA: Diagnosis not present

## 2024-02-01 DIAGNOSIS — Z7689 Persons encountering health services in other specified circumstances: Secondary | ICD-10-CM | POA: Diagnosis not present

## 2024-02-01 DIAGNOSIS — C01 Malignant neoplasm of base of tongue: Secondary | ICD-10-CM | POA: Diagnosis not present

## 2024-02-08 DIAGNOSIS — Z7689 Persons encountering health services in other specified circumstances: Secondary | ICD-10-CM | POA: Diagnosis not present

## 2024-02-22 DIAGNOSIS — R1314 Dysphagia, pharyngoesophageal phase: Secondary | ICD-10-CM | POA: Diagnosis not present

## 2024-02-24 DIAGNOSIS — Z419 Encounter for procedure for purposes other than remedying health state, unspecified: Secondary | ICD-10-CM | POA: Diagnosis not present

## 2024-03-06 DIAGNOSIS — C3411 Malignant neoplasm of upper lobe, right bronchus or lung: Secondary | ICD-10-CM | POA: Diagnosis not present

## 2024-03-06 DIAGNOSIS — J439 Emphysema, unspecified: Secondary | ICD-10-CM | POA: Diagnosis not present
